# Patient Record
Sex: Male | Born: 1960
Health system: Southern US, Community
[De-identification: ages and names within clinical notes are randomized; demographics above are authoritative.]

## PROBLEM LIST (undated history)

## (undated) DIAGNOSIS — C801 Malignant (primary) neoplasm, unspecified: Secondary | ICD-10-CM

## (undated) DIAGNOSIS — G4700 Insomnia, unspecified: Secondary | ICD-10-CM

## (undated) DIAGNOSIS — I10 Essential (primary) hypertension: Secondary | ICD-10-CM

## (undated) DIAGNOSIS — E7800 Pure hypercholesterolemia, unspecified: Secondary | ICD-10-CM

## (undated) DIAGNOSIS — N189 Chronic kidney disease, unspecified: Secondary | ICD-10-CM

## (undated) HISTORY — PX: OTHER SURGICAL HISTORY: SHX169

## (undated) HISTORY — PX: NO PAST SURGERIES: SHX2092

---

## 2004-04-09 ENCOUNTER — Emergency Department (HOSPITAL_COMMUNITY): Admission: EM | Admit: 2004-04-09 | Discharge: 2004-04-10 | Payer: Self-pay

## 2005-08-22 ENCOUNTER — Emergency Department (HOSPITAL_COMMUNITY): Admission: EM | Admit: 2005-08-22 | Discharge: 2005-08-22 | Payer: Self-pay | Admitting: Emergency Medicine

## 2009-03-07 ENCOUNTER — Ambulatory Visit: Payer: Self-pay | Admitting: Orthopedic Surgery

## 2009-03-07 DIAGNOSIS — M79609 Pain in unspecified limb: Secondary | ICD-10-CM | POA: Insufficient documentation

## 2009-05-03 ENCOUNTER — Ambulatory Visit (HOSPITAL_COMMUNITY): Admission: RE | Admit: 2009-05-03 | Discharge: 2009-05-03 | Payer: Self-pay | Admitting: Family Medicine

## 2009-08-20 ENCOUNTER — Ambulatory Visit (HOSPITAL_COMMUNITY): Admission: RE | Admit: 2009-08-20 | Discharge: 2009-08-20 | Payer: Self-pay | Admitting: Urology

## 2009-12-05 ENCOUNTER — Ambulatory Visit (HOSPITAL_COMMUNITY): Admission: RE | Admit: 2009-12-05 | Discharge: 2009-12-05 | Payer: Self-pay | Admitting: Family Medicine

## 2010-12-16 ENCOUNTER — Ambulatory Visit: Payer: Self-pay | Admitting: Internal Medicine

## 2011-01-27 NOTE — Letter (Signed)
Summary: Historic Patient File  Historic Patient File   Imported By: Elvera Maria 03/12/2009 10:35:47  _____________________________________________________________________  External Attachment:    Type:   Image     Comment:   history

## 2011-01-27 NOTE — Assessment & Plan Note (Signed)
Summary: RT FOOT PAIN/NEEDS XRAY/REF [facility]   Vital Signs:  Patient profile:   50 year old male Weight:      190 pounds Pulse (ortho):   74 / minute Resp:     18 per minute  Vitals Entered By: Fuller Canada MD (March 07, 2009 11:05 AM)  History of Present Illness: Today 03/07/09 we are seeing Travis Warner for an initial visit for right foot pain, consult Dr. Regino Schultze.  [name] has had pain in his RIGHT foot for the last 2 months no associated injury.  The pain is on the plantar aspect of the foot under the second and third digits.  He has a history of gout of the great toe and actually took some medicine yesterday and it feels better today.  His pain initially was under the ball of his foot second and third digit it progressed to the great toe and relieved by thegout medicine as stated.  He also took some prednisone 5 mg dose pack which helped while he was taking it but the pain returned after the medicine was completed.  He also took some Vicodin as well some ibuprofen.  He only has pain when he is walking.  Pain only with walking.    Preventive Screening-Counseling & Management     Alcohol drinks/day: 0     Smoking Status: never     Caffeine use/day: 0  Allergies (verified): No Known Drug Allergies  Past Medical History:    gout  Past Surgical History:    NA  Family History:    NA  Social History:    Patient is married.     childcare    Alcohol drinks/day:  0    Smoking Status:  never    Caffeine use/day:  0  Review of Systems GU:  Complains of kidney stones; denies kidney failure, kidney transplant, burning, poor stream, testicular cancer, blood in urine, and . MS:  Complains of gout; denies joint pain, rheumatoid arthritis, joint swelling, bone cancer, osteoporosis, and . Derm:  Complains of cancer; denies eczema and itching.  The review of systems is negative for General, Cardiac , Resp, GI, Neuro, Endo, Psych, EENT, Immunology, and  Lymphatic.  Physical Exam  Additional Exam:  this is a normally developed well-nourished male with normal pulses in his RIGHT foot there is no lymphangitis in the skin is normal  Sensation is intact to soft touch and pressure and he is awake and alert  On inspection there is tenderness over the great toe none under the second and third digit there is no pain with range of motion of the first 3 digits and there is normal range of motion of the great toe second and third digit.   Impression & Recommendations:  Problem # 1:  FOOT PAIN, RIGHT (ICD-729.5) Assessment New  Orders: New Patient Level III (98119) Foot x-ray complete, minimum 3 views (14782)  Patient Instructions: 1)  as needed

## 2011-01-27 NOTE — Letter (Signed)
Summary: *Orthopedic Consult Note  [facility]  [address]  [place], [place] [address]   Phone: [phone]  Fax: [phone]    Re:    Travis Warner DOB:    09-27-1961   Dear: Dr Regino Schultze   Thank you for requesting that we see the above patient for consultation.  A copy of the detailed office note will be sent under separate cover, for your review.  Evaluation today is consistent with: Metatarsalgia   Our recommendation is for: Metatarsal pads        Thank you for this opportunity to look after your patient.  Sincerely,   Terrance Mass. MD.

## 2011-12-08 ENCOUNTER — Telehealth: Payer: Self-pay

## 2011-12-08 NOTE — Telephone Encounter (Signed)
MOVI PREP SPLIT DOSING, REGULAR BREAKFAST. CLEAR LIQUIDS AFTER 9 AM.  

## 2011-12-08 NOTE — Telephone Encounter (Signed)
Gastroenterology Pre-Procedure Form   Pt's wife to call back and schedule date and time   Request date: 12/07/2011      Requesting physician: Lenise Herald, PA-C at [facility]   PATIENT INFORMATION:  Travis Warner is a 50 y.o., male (DOB=05-04-1961).  PROCEDURE: Procedure(s) requested: colonoscopy Procedure Reason: screening for colon cancer  PATIENT REVIEW QUESTIONS: The patient reports the following:   1. Diabetes Melitis: no 2. Joint replacements in the past 12 months: no 3. Major health problems in the past 3 months: no 4. Has an artificial valve or MVP:no 5. Has been advised in past to take antibiotics in advance of a procedure like teeth cleaning: no}    MEDICATIONS & ALLERGIES:    Patient reports the following regarding taking any blood thinners:   Plavix? no Aspirin?no Coumadin?  no  Patient confirms/reports the following medications:  Current Outpatient Prescriptions  Medication Sig Dispense Refill  . fluticasone (FLONASE) 50 MCG/ACT nasal spray Place 2 sprays into the nose daily.        . simvastatin (ZOCOR) 20 MG tablet Take 20 mg by mouth at bedtime.          Patient confirms/reports the following allergies:  No Known Allergies  Patient is appropriate to schedule for requested procedure(s): yes  AUTHORIZATION INFORMATION Primary Insurance:   ID #:   Group #:   Pre-Cert / Auth #:   Secondary Insurance:  ID #:   Group #:  Pre-Cert / Auth required:  Pre-Cert / Auth #:   No orders of the defined types were placed in this encounter.    SCHEDULE INFORMATION: Procedure has been scheduled as follows:  Date:                           Time Location: [facility] Short Stay  This Gastroenterology Pre-Precedure Form is being routed to the following provider(s) for review: Jonette Eva, MD

## 2011-12-10 NOTE — Telephone Encounter (Signed)
LMOM to call.

## 2011-12-14 NOTE — Telephone Encounter (Signed)
Spoke with pt's wife. She said he has been sick over the week-end. He is planning to schedule around first part of Jan.

## 2011-12-17 NOTE — Telephone Encounter (Signed)
Spoke to pt's wife. She is going to discuss with husband and call me back.

## 2011-12-23 NOTE — Telephone Encounter (Signed)
Letter mailed to pt to call and schedule appointment.

## 2011-12-26 ENCOUNTER — Emergency Department (HOSPITAL_COMMUNITY): Payer: PRIVATE HEALTH INSURANCE

## 2011-12-26 ENCOUNTER — Emergency Department (HOSPITAL_COMMUNITY)
Admission: EM | Admit: 2011-12-26 | Discharge: 2011-12-26 | Disposition: A | Discharge status: Home / Self Care | Payer: PRIVATE HEALTH INSURANCE | Attending: Emergency Medicine | Admitting: Emergency Medicine

## 2011-12-26 DIAGNOSIS — N200 Calculus of kidney: Secondary | ICD-10-CM | POA: Insufficient documentation

## 2011-12-26 DIAGNOSIS — E7800 Pure hypercholesterolemia, unspecified: Secondary | ICD-10-CM | POA: Insufficient documentation

## 2011-12-26 HISTORY — DX: Pure hypercholesterolemia, unspecified: E78.00

## 2011-12-26 LAB — URINALYSIS, ROUTINE W REFLEX MICROSCOPIC
Bilirubin Urine: NEGATIVE
Glucose, UA: NEGATIVE mg/dL
Ketones, ur: NEGATIVE mg/dL
Leukocytes, UA: NEGATIVE
Nitrite: NEGATIVE
Protein, ur: NEGATIVE mg/dL
Specific Gravity, Urine: 1.01 (ref 1.005–1.030)
Urobilinogen, UA: 0.2 mg/dL (ref 0.0–1.0)
pH: 6 (ref 5.0–8.0)

## 2011-12-26 LAB — URINE MICROSCOPIC-ADD ON

## 2011-12-26 MED ORDER — ONDANSETRON HCL 4 MG PO TABS: 8.0000 mg | ORAL_TABLET | Freq: Four times a day (QID) | ORAL | Status: AC

## 2011-12-26 MED ORDER — OXYCODONE-ACETAMINOPHEN 5-325 MG PO TABS
2.0000 | ORAL_TABLET | ORAL | Status: AC | PRN
Start: 2011-12-26 — End: 2012-01-05

## 2011-12-26 NOTE — ED Provider Notes (Signed)
History     CSN: [id number]  Arrival date & time 12/26/11  1232   First MD Initiated Contact with Patient 12/26/11 1308      Chief Complaint  Patient presents with  . Urinary Urgency  . Urinary Frequency  . Dysuria    (Consider location/radiation/quality/duration/timing/severity/associated sxs/prior treatment) HPI......Marland Kitchenurinary frequency, urgency, dysuria for approximately 3 days. No hematuria, flank pain, fever, chills.  Eating normally.nothing makes it better or worse.  remote history of kidney stone.  No suprapubic pain.  Discomfort is moderate  Past Medical History  Diagnosis Date  . Hypercholesterolemia     History reviewed. No pertinent past surgical history.  No family history on file.  History  Substance Use Topics  . Smoking status: Never Smoker   . Smokeless tobacco: Not on file  . Alcohol Use: No      Review of Systems  All other systems reviewed and are negative.    Allergies  Review of patient's allergies indicates no known allergies.  Home Medications   Current Outpatient Rx  Name Route Sig Dispense Refill  . FLUTICASONE PROPIONATE 50 MCG/ACT NA SUSP Nasal Place 2 sprays into the nose daily.      Marland Kitchen SIMVASTATIN 20 MG PO TABS Oral Take 20 mg by mouth at bedtime.      Marland Kitchen ZOLPIDEM TARTRATE 10 MG PO TABS Oral Take 10 mg by mouth at bedtime as needed. sleep     . ONDANSETRON HCL 4 MG PO TABS Oral Take 2 tablets (8 mg total) by mouth every 6 (six) hours. 12 tablet 0  . OXYCODONE-ACETAMINOPHEN 5-325 MG PO TABS Oral Take 2 tablets by mouth every 4 (four) hours as needed for pain. 20 tablet 0    BP 154/96  Pulse 64  Temp(Src) 97.7 F (36.5 C) (Oral)  Resp 18  Ht 5\' 11"  (1.803 m)  Wt 190 lb (86.183 kg)  BMI 26.50 kg/m2  SpO2 100%  Physical Exam  Nursing note and vitals reviewed. Constitutional: He is oriented to person, place, and time. He appears well-developed and well-nourished.  HENT:  Head: Normocephalic and atraumatic.  Eyes:  Conjunctivae and EOM are normal. Pupils are equal, round, and reactive to light.  Neck: Normal range of motion. Neck supple.  Cardiovascular: Normal rate and regular rhythm.   Pulmonary/Chest: Effort normal and breath sounds normal.  Abdominal: Soft. Bowel sounds are normal.  Musculoskeletal: Normal range of motion.  Neurological: He is alert and oriented to person, place, and time.  Skin: Skin is warm and dry.  Psychiatric: He has a normal mood and affect.    ED Course  Procedures (including critical care time)  Labs Reviewed  URINALYSIS, ROUTINE W REFLEX MICROSCOPIC - Abnormal; Notable for the following:    Hgb urine dipstick MODERATE (*)    All other components within normal limits  URINE MICROSCOPIC-ADD ON   Ct Abdomen Pelvis Wo Contrast  12/26/2011  *RADIOLOGY REPORT*  Clinical Data: Hematuria, lower pelvic pain  CT ABDOMEN AND PELVIS WITHOUT CONTRAST  Technique:  Multidetector CT imaging of the abdomen and pelvis was performed following the standard protocol without intravenous contrast.  Comparison: 08/22/2005  Findings: Lung bases are unremarkable.  Sagittal images of the spine shows mild anterior spurring upper endplate of the L3 vertebral body.  Unenhanced liver shows no biliary ductal dilatation.  The gallbladder is contracted without evidence of calcified gallstones. Unenhanced pancreas, spleen and adrenal glands are unremarkable. There is a nonobstructive calcification in the upper pole of the right kidney  measures 1.6 mm.  Nonobstructive calcified calculus upper pole of the left kidney measures 2 mm.  No hydronephrosis or hydroureter.  In axial image 62 of there is a 5 mm calcified calculus in distal right ureter about 2.4 cm from right UVJ.  No significant ureteral dilatation.  No calcified calculi are noted along the expected course of the left ureter.  No small bowel obstruction.  No ascites or free air.  No adenopathy.  Stool noted in the right colon and transverse colon. No  pericecal inflammation.  The urinary bladder is under distended grossly unremarkable.  No calcified calculi are noted within urinary bladder.  Prostate gland measures 4.1 x 2.9 cm.  Normal appendix is clearly visualized axial image 56  IMPRESSION:  1.  Bilateral nonobstructive nephrolithiasis.  No hydronephrosis or hydroureter. 2.  There is a 5 mm calcified calculus in the distal right ureter about 2.4 cm from right UVJ.  No significant ureteral dilatation. No left ureteral calculi are identified. 3.  Normal appendix.  No pericecal inflammation.  Original Report Authenticated By: Natasha Mead, M.D.     1. Right kidney stone       MDM  Patient has no pain. Urinalysis shows hemoglobin. CT scan shows a 5 mm stone in the distal right ureter.  Discussed findings with patient and wife. They understand to get urology followup        Donnetta Hutching, MD 12/26/11 1616

## 2011-12-26 NOTE — ED Notes (Signed)
Pt c/o a constant urge to urinate. Pt c/o burning to tip of penis with urination and a continued urge after completion.

## 2011-12-26 NOTE — ED Notes (Addendum)
Pt presents with urinary urgency and increased frequency. Pt states once he starts to urinate he experiences pain in the groin area. Pt denies blood in urine. Pt states symptoms started 3 days ago.

## 2012-02-09 ENCOUNTER — Telehealth: Payer: Self-pay

## 2012-02-09 ENCOUNTER — Other Ambulatory Visit: Payer: Self-pay

## 2012-02-09 DIAGNOSIS — Z139 Encounter for screening, unspecified: Secondary | ICD-10-CM

## 2012-02-09 NOTE — Telephone Encounter (Addendum)
Gastroenterology Pre-Procedure Form  Pt's wife called and gave info to schedule pt  Request Date: 02/09/2012       Requesting Physician: Lenise Herald, PA-C at [facility]     PATIENT INFORMATION:  Travis Warner is a 51 y.o., male (DOB=[birth year]).  PROCEDURE: Procedure(s) requested: colonoscopy Procedure Reason: screening for colon cancer  PATIENT REVIEW QUESTIONS: The patient reports the following:   1. Diabetes Melitis: no 2. Joint replacements in the past 12 months: no 3. Major health problems in the past 3 months: no 4. Has an artificial valve or MVP:no 5. Has been advised in past to take antibiotics in advance of a procedure like teeth cleaning: no}    MEDICATIONS & ALLERGIES:    Patient reports the following regarding taking any blood thinners:   Plavix? no Aspirin?no Coumadin?  no  Patient confirms/reports the following medications:  Current Outpatient Prescriptions  Medication Sig Dispense Refill  . fluticasone (FLONASE) 50 MCG/ACT nasal spray Place 2 sprays into the nose daily.        . simvastatin (ZOCOR) 20 MG tablet Take 20 mg by mouth at bedtime.        Marland Kitchen zolpidem (AMBIEN) 10 MG tablet Take 10 mg by mouth at bedtime as needed. Pt's wife said he takes very seldom        Patient confirms/reports the following allergies:  No Known Allergies  Patient is appropriate to schedule for requested procedure(s): yes  AUTHORIZATION INFORMATION Primary Insurance:   ID #:   Group #:  Pre-Cert / Auth required: Pre-Cert / Auth #:   Secondary Insurance:   ID #:   Group #:  Pre-Cert / Auth required Pre-Cert / Auth #:   No orders of the defined types were placed in this encounter.    SCHEDULE INFORMATION: Procedure has been scheduled as follows:  Date: 02/24/2012     Time: 12:25 pm per Selena Batten Location: [facility] Short Stay  This Gastroenterology Pre-Precedure Form is being routed to the following provider(s) for review: R. Roetta Sessions, MD

## 2012-02-10 NOTE — Telephone Encounter (Signed)
Looks like [facility] patient? Initial triage in 11/2011. OK to schedule.

## 2012-02-11 NOTE — Telephone Encounter (Signed)
Pt has never seen Dr. Jena Gauss or Dr. Darrick Penna. Although  Previously triaged for Dr. Darrick Penna was trying to be scheduled on Wed that week, and Dr. Darrick Penna in office on that day.  Rx and instructions mailed.

## 2012-02-22 ENCOUNTER — Encounter (HOSPITAL_COMMUNITY): Payer: Self-pay | Admitting: Pharmacy Technician

## 2012-02-23 MED ORDER — SODIUM CHLORIDE 0.45 % IV SOLN
Freq: Once | INTRAVENOUS | Status: AC
  Administered 2012-02-24: 11:00:00 via INTRAVENOUS

## 2012-02-24 ENCOUNTER — Encounter (HOSPITAL_COMMUNITY): Payer: Self-pay | Admitting: *Deleted

## 2012-02-24 ENCOUNTER — Encounter (HOSPITAL_COMMUNITY)
Admission: RE | Disposition: A | Discharge status: Home / Self Care | Payer: Self-pay | Source: Ambulatory Visit | Attending: Internal Medicine

## 2012-02-24 ENCOUNTER — Ambulatory Visit (HOSPITAL_COMMUNITY)
Admission: RE | Admit: 2012-02-24 | Discharge: 2012-02-24 | Disposition: A | Discharge status: Home / Self Care | Payer: No Typology Code available for payment source | Source: Ambulatory Visit | Attending: Internal Medicine | Admitting: Internal Medicine

## 2012-02-24 DIAGNOSIS — Z139 Encounter for screening, unspecified: Secondary | ICD-10-CM

## 2012-02-24 DIAGNOSIS — D126 Benign neoplasm of colon, unspecified: Secondary | ICD-10-CM

## 2012-02-24 DIAGNOSIS — Z1211 Encounter for screening for malignant neoplasm of colon: Secondary | ICD-10-CM | POA: Insufficient documentation

## 2012-02-24 DIAGNOSIS — E7800 Pure hypercholesterolemia, unspecified: Secondary | ICD-10-CM | POA: Insufficient documentation

## 2012-02-24 HISTORY — DX: Insomnia, unspecified: G47.00

## 2012-02-24 HISTORY — DX: Malignant (primary) neoplasm, unspecified: C80.1

## 2012-02-24 HISTORY — DX: Chronic kidney disease, unspecified: N18.9

## 2012-02-24 HISTORY — PX: COLONOSCOPY: SHX5424

## 2012-02-24 SURGERY — COLONOSCOPY
Anesthesia: Moderate Sedation

## 2012-02-24 MED ORDER — MIDAZOLAM HCL 5 MG/5ML IJ SOLN
INTRAMUSCULAR | Status: AC
  Filled 2012-02-24: qty 10

## 2012-02-24 MED ORDER — STERILE WATER FOR IRRIGATION IR SOLN
Status: DC | PRN
  Administered 2012-02-24: 12:00:00

## 2012-02-24 MED ORDER — MEPERIDINE HCL 100 MG/ML IJ SOLN
INTRAMUSCULAR | Status: DC | PRN
  Administered 2012-02-24: 25 mg via INTRAVENOUS
  Administered 2012-02-24: 50 mg via INTRAVENOUS
  Administered 2012-02-24: 25 mg via INTRAVENOUS

## 2012-02-24 MED ORDER — MEPERIDINE HCL 100 MG/ML IJ SOLN
INTRAMUSCULAR | Status: AC
  Filled 2012-02-24: qty 2

## 2012-02-24 MED ORDER — MIDAZOLAM HCL 5 MG/5ML IJ SOLN
INTRAMUSCULAR | Status: DC | PRN
  Administered 2012-02-24: 2 mg via INTRAVENOUS
  Administered 2012-02-24 (×2): 1 mg via INTRAVENOUS

## 2012-02-24 NOTE — H&P (Signed)
  Primary Care Physician:  Colette Ribas, MD, MD Primary Gastroenterologist:  Dr.   Pre-Procedure History & Physical: HPI:  Travis Warner is a 51 y.o. male is here for a screening colonoscopy. First-ever colonoscopy. No bowel symptoms. No family history of colon cancer or colon polyps.  Past Medical History  Diagnosis Date  . Hypercholesterolemia   . Insomnia   . Cancer     skin cancer- basal cell on face  . Chronic kidney disease     kidney stone    Past Surgical History  Procedure Date  . No past surgeries     Prior to Admission medications   Medication Sig Start Date End Date Taking? Authorizing Provider  simvastatin (ZOCOR) 20 MG tablet Take 20 mg by mouth at bedtime.     Yes Historical Provider, MD  Vitamins-Lipotropics (LIPO-FLAVONOID PLUS) TABS Take 1 tablet by mouth 3 (three) times daily.   Yes Historical Provider, MD  zolpidem (AMBIEN) 10 MG tablet Take 10 mg by mouth at bedtime as needed. Pt's wife said he takes very seldom   Yes Historical Provider, MD  fluticasone (FLONASE) 50 MCG/ACT nasal spray Place 2 sprays into the nose daily. Only uses when needed    Historical Provider, MD    Allergies as of 02/09/2012  . (No Known Allergies)    Family History  Problem Relation Age of Onset  . COPD Mother   . Liver cancer Father     History   Social History  . Marital Status: Married    Spouse Name: N/A    Number of Children: N/A  . Years of Education: N/A   Occupational History  . Not on file.   Social History Main Topics  . Smoking status: Never Smoker   . Smokeless tobacco: Not on file  . Alcohol Use: No  . Drug Use: No  . Sexually Active: Yes   Other Topics Concern  . Not on file   Social History Narrative  . No narrative on file    Review of Systems: See HPI, otherwise negative ROS  Physical Exam: BP 155/103  Pulse 96  Temp(Src) 98.9 F (37.2 C) (Oral)  Resp 16  Ht 5\' 11"  (1.803 m)  Wt 190 lb (86.183 kg)  BMI 26.50 kg/m2  SpO2  100% General:   Alert,  Well-developed, well-nourished, pleasant and cooperative in NAD Head:  Normocephalic and atraumatic. Eyes:  Sclera clear, no icterus.   Conjunctiva pink. Ears:  Normal auditory acuity. Nose:  No deformity, discharge,  or lesions. Mouth:  No deformity or lesions, dentition normal. Neck:  Supple; no masses or thyromegaly. Lungs:  Clear throughout to auscultation.   No wheezes, crackles, or rhonchi. No acute distress. Heart:  Regular rate and rhythm; no murmurs, clicks, rubs,  or gallops. Abdomen:  Soft, nontender and nondistended. No masses, hepatosplenomegaly or hernias noted. Normal bowel sounds, without guarding, and without rebound.   Msk:  Symmetrical without gross deformities. Normal posture. Pulses:  Normal pulses noted. Extremities:  Without clubbing or edema. Neurologic:  Alert and  oriented x4;  grossly normal neurologically. Skin:  Intact without significant lesions or rashes. Cervical Nodes:  No significant cervical adenopathy. Psych:  Alert and cooperative. Normal mood and affect.  Impression/Plan: Travis Warner is now here to undergo a screening colonoscopy.   First-ever average risk screening examination  Risks, benefits, limitations, imponderables and alternatives regarding colonoscopy have been reviewed with the patient. Questions have been answered. All parties agreeable.

## 2012-02-24 NOTE — Op Note (Signed)
[facility] [address] [place], [place]  [address]  COLONOSCOPY PROCEDURE REPORT  PATIENT:  Travis Warner, Travis Warner  MR#:  [id number] BIRTHDATE:  07/25/1961, 50 yrs. old  GENDER:  male ENDOSCOPIST:  R. Roetta Sessions, MD FACP [facility] REF. BY:  Assunta Found, M.D. PROCEDURE DATE:  02/24/2012 PROCEDURE:  Colonoscopy with biopsy and snare polypectomy  INDICATIONS:  First ever average risk screening colonoscopy  INFORMED CONSENT:  The risks, benefits, alternatives and imponderables including but not limited to bleeding, perforation as well as the possibility of a missed lesion have been reviewed. The potential for biopsy, lesion removal, etc. have also been discussed.  Questions have been answered.  All parties agreeable. Please see the history and physical in the medical record for more information.  MEDICATIONS:  Versed 4 mg IV and Demerol 100 mg IV in divided doses  DESCRIPTION OF PROCEDURE:  After a digital rectal exam was performed, the EC-[id number] ([id number]) colonoscope was advanced from the anus through the rectum and colon to the area of the cecum, ileocecal valve and appendiceal orifice.  The cecum was deeply intubated.  These structures were well-seen and photographed for the record.  From the level of the cecum and ileocecal valve, the scope was slowly and cautiously withdrawn.  The mucosal surfaces were carefully surveyed utilizing scope tip deflection to facilitate fold flattening as needed.  The scope was pulled down into the rectum where a thorough examination including retroflexion was performed. <<PROCEDUREIMAGES>>  FINDINGS: Adequate preparation. Normal rectum. 6 mm pedunculated polyp in the mid descending colon. A second, diminutive polyp in the mid                                         ascending colon; remainder of colonic mucosa appeared normal.  THERAPEUTIC / DIAGNOSTIC MANEUVERS PERFORMED:    The above-mentioned polyps were hot snared and cold  biopsy/removed, respectively. COMPLICATIONS:  None  CECAL WITHDRAWAL TIME:   8 minutes  IMPRESSION:    Colonic polyps -- removed as described above  RECOMMENDATIONS:    Followup on pathology  ______________________________ R. Roetta Sessions, MD Caleen Essex  CC:  Assunta Found, M.D.  n. eSIGNED:   R. Roetta Sessions at 02/24/2012 11:59 AM  Travis Warner, [place], [phone]

## 2012-02-24 NOTE — Discharge Instructions (Addendum)
Colonoscopy Discharge Instructions  Read the instructions outlined below and refer to this sheet in the next few weeks. These discharge instructions provide you with general information on caring for yourself after you leave the hospital. Your doctor may also give you specific instructions. While your treatment has been planned according to the most current medical practices available, unavoidable complications occasionally occur. If you have any problems or questions after discharge, call Dr. Jena Gauss at [phone]. ACTIVITY  You may resume your regular activity, but move at a slower pace for the next 24 hours.   Take frequent rest periods for the next 24 hours.   Walking will help get rid of the air and reduce the bloated feeling in your belly (abdomen).   No driving for 24 hours (because of the medicine (anesthesia) used during the test).    Do not sign any important legal documents or operate any machinery for 24 hours (because of the anesthesia used during the test).  NUTRITION  Drink plenty of fluids.   You may resume your normal diet as instructed by your doctor.   Begin with a light meal and progress to your normal diet. Heavy or fried foods are harder to digest and may make you feel sick to your stomach (nauseated).   Avoid alcoholic beverages for 24 hours or as instructed.  MEDICATIONS  You may resume your normal medications unless your doctor tells you otherwise.  WHAT YOU CAN EXPECT TODAY  Some feelings of bloating in the abdomen.   Passage of more gas than usual.   Spotting of blood in your stool or on the toilet paper.  IF YOU HAD POLYPS REMOVED DURING THE COLONOSCOPY:  No aspirin products for 7 days or as instructed.   No alcohol for 7 days or as instructed.   Eat a soft diet for the next 24 hours.  FINDING OUT THE RESULTS OF YOUR TEST Not all test results are available during your visit. If your test results are not back during the visit, make an appointment  with your caregiver to find out the results. Do not assume everything is normal if you have not heard from your caregiver or the medical facility. It is important for you to follow up on all of your test results.  SEEK IMMEDIATE MEDICAL ATTENTION IF:  You have more than a spotting of blood in your stool.   Your belly is swollen (abdominal distention).   You are nauseated or vomiting.   You have a temperature over 101.   You have abdominal pain or discomfort that is severe or gets worse throughout the day.   Polyp information provided.  Further recommendations to follow pending review of pathology report.Colon Polyps A polyp is extra tissue that grows inside your body. Colon polyps grow in the large intestine. The large intestine, also called the colon, is part of your digestive system. It is a long, hollow tube at the end of your digestive tract where your body makes and stores stool. Most polyps are not dangerous. They are benign. This means they are not cancerous. But over time, some types of polyps can turn into cancer. Polyps that are smaller than a pea are usually not harmful. But larger polyps could someday become or may already be cancerous. To be safe, doctors remove all polyps and test them.  WHO GETS POLYPS? Anyone can get polyps, but certain people are more likely than others. You may have a greater chance of getting polyps if:  You are over  50.   You have had polyps before.   Someone in your family has had polyps.   Someone in your family has had cancer of the large intestine.   Find out if someone in your family has had polyps. You may also be more likely to get polyps if you:   Eat a lot of fatty foods.   Smoke.   Drink alcohol.   Do not exercise.   Eat too much.  SYMPTOMS  Most small polyps do not cause symptoms. People often do not know they have one until their caregiver finds it during a regular checkup or while testing them for something else. Some people  do have symptoms like these:  Bleeding from the anus. You might notice blood on your underwear or on toilet paper after you have had a bowel movement.   Constipation or diarrhea that lasts more than a week.   Blood in the stool. Blood can make stool look black or it can show up as red streaks in the stool.  If you have any of these symptoms, see your caregiver. HOW DOES THE DOCTOR TEST FOR POLYPS? The doctor can use four tests to check for polyps:  Digital rectal exam. The caregiver wears gloves and checks your rectum (the last part of the large intestine) to see if it feels normal. This test would find polyps only in the rectum. Your caregiver may need to do one of the other tests listed below to find polyps higher up in the intestine.   Barium enema. The caregiver puts a liquid called barium into your rectum before taking x-rays of your large intestine. Barium makes your intestine look white in the pictures. Polyps are dark, so they are easy to see.   Sigmoidoscopy. With this test, the caregiver can see inside your large intestine. A thin flexible tube is placed into your rectum. The device is called a sigmoidoscope, which has a light and a tiny video camera in it. The caregiver uses the sigmoidoscope to look at the last third of your large intestine.   Colonoscopy. This test is like sigmoidoscopy, but the caregiver looks at all of the large intestine. It usually requires sedation. This is the most common method for finding and removing polyps.  TREATMENT   The caregiver will remove the polyp during sigmoidoscopy or colonoscopy. The polyp is then tested for cancer.   If you have had polyps, your caregiver may want you to get tested regularly in the future.  PREVENTION  There is not one sure way to prevent polyps. You might be able to lower your risk of getting them if you:  Eat more fruits and vegetables and less fatty food.   Do not smoke.   Avoid alcohol.   Exercise every day.    Lose weight if you are overweight.   Eating more calcium and folate can also lower your risk of getting polyps. Some foods that are rich in calcium are milk, cheese, and broccoli. Some foods that are rich in folate are chickpeas, kidney beans, and spinach.   Aspirin might help prevent polyps. Studies are under way.  Document Released: 09/09/2004 Document Revised: 08/26/2011 Document Reviewed: 02/15/2008 [facility] Patient Information 2012 [place], [place].

## 2012-02-27 ENCOUNTER — Encounter: Payer: Self-pay | Admitting: Internal Medicine

## 2012-03-01 ENCOUNTER — Encounter (HOSPITAL_COMMUNITY): Payer: Self-pay | Admitting: Internal Medicine

## 2013-11-29 ENCOUNTER — Other Ambulatory Visit: Payer: Self-pay | Admitting: Dermatology

## 2015-02-07 ENCOUNTER — Emergency Department (HOSPITAL_COMMUNITY): Payer: 59

## 2015-02-07 ENCOUNTER — Encounter (HOSPITAL_COMMUNITY): Payer: Self-pay | Admitting: Emergency Medicine

## 2015-02-07 ENCOUNTER — Emergency Department (HOSPITAL_COMMUNITY)
Admission: EM | Admit: 2015-02-07 | Discharge: 2015-02-07 | Disposition: A | Discharge status: Home / Self Care | Payer: 59 | Attending: Emergency Medicine | Admitting: Emergency Medicine

## 2015-02-07 DIAGNOSIS — Z85828 Personal history of other malignant neoplasm of skin: Secondary | ICD-10-CM | POA: Diagnosis not present

## 2015-02-07 DIAGNOSIS — N189 Chronic kidney disease, unspecified: Secondary | ICD-10-CM | POA: Insufficient documentation

## 2015-02-07 DIAGNOSIS — Z8669 Personal history of other diseases of the nervous system and sense organs: Secondary | ICD-10-CM | POA: Diagnosis not present

## 2015-02-07 DIAGNOSIS — R0789 Other chest pain: Secondary | ICD-10-CM

## 2015-02-07 DIAGNOSIS — R079 Chest pain, unspecified: Secondary | ICD-10-CM

## 2015-02-07 DIAGNOSIS — Z8639 Personal history of other endocrine, nutritional and metabolic disease: Secondary | ICD-10-CM | POA: Diagnosis not present

## 2015-02-07 DIAGNOSIS — Z87448 Personal history of other diseases of urinary system: Secondary | ICD-10-CM | POA: Diagnosis not present

## 2015-02-07 LAB — COMPREHENSIVE METABOLIC PANEL
ALT: 40 U/L (ref 0–53)
AST: 27 U/L (ref 0–37)
Albumin: 4.5 g/dL (ref 3.5–5.2)
Alkaline Phosphatase: 71 U/L (ref 39–117)
Anion gap: 4 — ABNORMAL LOW (ref 5–15)
BUN: 21 mg/dL (ref 6–23)
CO2: 30 mmol/L (ref 19–32)
Calcium: 9.5 mg/dL (ref 8.4–10.5)
Chloride: 106 mmol/L (ref 96–112)
Creatinine, Ser: 1.18 mg/dL (ref 0.50–1.35)
GFR calc Af Amer: 80 mL/min — ABNORMAL LOW (ref >90)
GFR calc non Af Amer: 69 mL/min — ABNORMAL LOW (ref >90)
Glucose, Bld: 100 mg/dL — ABNORMAL HIGH (ref 70–99)
Potassium: 3.4 mmol/L — ABNORMAL LOW (ref 3.5–5.1)
Sodium: 140 mmol/L (ref 135–145)
Total Bilirubin: 0.8 mg/dL (ref 0.3–1.2)
Total Protein: 7.5 g/dL (ref 6.0–8.3)

## 2015-02-07 LAB — CBC WITH DIFFERENTIAL/PLATELET
Basophils Absolute: 0 10*3/uL (ref 0.0–0.1)
Basophils Relative: 0 % (ref 0–1)
Eosinophils Absolute: 0.2 10*3/uL (ref 0.0–0.7)
Eosinophils Relative: 3 % (ref 0–5)
HCT: 44.2 % (ref 39.0–52.0)
Hemoglobin: 15 g/dL (ref 13.0–17.0)
Lymphocytes Relative: 44 % (ref 12–46)
Lymphs Abs: 3.2 10*3/uL (ref 0.7–4.0)
MCH: 30.4 pg (ref 26.0–34.0)
MCHC: 33.9 g/dL (ref 30.0–36.0)
MCV: 89.7 fL (ref 78.0–100.0)
Monocytes Absolute: 0.6 10*3/uL (ref 0.1–1.0)
Monocytes Relative: 8 % (ref 3–12)
Neutro Abs: 3.2 10*3/uL (ref 1.7–7.7)
Neutrophils Relative %: 45 % (ref 43–77)
Platelets: 219 10*3/uL (ref 150–400)
RBC: 4.93 MIL/uL (ref 4.22–5.81)
RDW: 13.1 % (ref 11.5–15.5)
WBC: 7.1 10*3/uL (ref 4.0–10.5)

## 2015-02-07 LAB — TROPONIN I: Troponin I: <0.03 ng/mL (ref <0.031)

## 2015-02-07 LAB — D-DIMER, QUANTITATIVE: D-Dimer, Quant: <0.27 ug/mL-FEU (ref 0.00–0.48)

## 2015-02-07 MED ORDER — KETOROLAC TROMETHAMINE 30 MG/ML IJ SOLN
30.0000 mg | Freq: Once | INTRAMUSCULAR | Status: AC
  Administered 2015-02-07: 30 mg via INTRAVENOUS
  Filled 2015-02-07: qty 1

## 2015-02-07 MED ORDER — NAPROXEN 500 MG PO TABS: 500.0000 mg | ORAL_TABLET | Freq: Two times a day (BID) | ORAL | Status: DC

## 2015-02-07 MED ORDER — IOHEXOL 350 MG/ML SOLN: 100.0000 mL | Freq: Once | INTRAVENOUS | Status: DC | PRN

## 2015-02-07 NOTE — ED Provider Notes (Signed)
CSN: [id number]     Arrival date & time 02/07/15  1093 History  This chart was scribed for Travis Essex, MD by Stephania Fragmin, ED Scribe. This patient was seen in room [facility] and the patient's care was started at 8:56 AM.    Chief Complaint  Patient presents with  . Chest Pain   The history is provided by the patient. No language interpreter was used.     HPI Comments: Travis Warner is a 54 y.o. male who presents to the Emergency Department complaining of constant left sided chest pain radiating to his back, ongoing since 2 days ago. Deep inspiration exacerbates it. Lying supine alleviates it. Nothing else makes it better or worse. He denies recent injuries. He denies a history of cardiac problems or any chronic health problems. He used to take hypertension and hyperlipidemia medication, but was taken off both because he was doing well and lost weight. He denies SOB, diaphoresis, dizziness, lightheadedness, nausea, vomiting, or abdominal pain.   Past Medical History  Diagnosis Date  . Hypercholesterolemia   . Insomnia   . Cancer     skin cancer- basal cell on face  . Chronic kidney disease     kidney stone   Past Surgical History  Procedure Laterality Date  . No past surgeries    . Colonoscopy  02/24/2012    Procedure: COLONOSCOPY;  Surgeon: Daneil Dolin, MD;  Location: [facility];  Service: Endoscopy;  Laterality: N/A;  11:30 AM   Family History  Problem Relation Age of Onset  . COPD Mother   . Liver cancer Father    History  Substance Use Topics  . Smoking status: Never Smoker   . Smokeless tobacco: Not on file  . Alcohol Use: No    Review of Systems  A complete 10 system review of systems was obtained and all systems are negative except as noted in the HPI and PMH.   Allergies  Review of patient's allergies indicates no known allergies.  Home Medications   Prior to Admission medications   Medication Sig Start Date End Date Taking? Authorizing Provider   ibuprofen (ADVIL,MOTRIN) 200 MG tablet Take 600 mg by mouth every 8 (eight) hours as needed for headache or moderate pain.   Yes Historical Provider, MD  naproxen (NAPROSYN) 500 MG tablet Take 1 tablet (500 mg total) by mouth 2 (two) times daily. 02/07/15   Travis Essex, MD   BP 142/82 mmHg  Pulse 60  Temp(Src) 97.7 F (36.5 C) (Oral)  Resp 12  Ht 5\' 9"  (1.753 m)  Wt 158 lb (71.668 kg)  BMI 23.32 kg/m2  SpO2 100% Physical Exam  Constitutional: He is oriented to person, place, and time. He appears well-developed and well-nourished. No distress.  HENT:  Head: Normocephalic and atraumatic.  Mouth/Throat: Oropharynx is clear and moist. No oropharyngeal exudate.  Eyes: Conjunctivae and EOM are normal. Pupils are equal, round, and reactive to light.  Neck: Normal range of motion. Neck supple.  No meningismus.  Cardiovascular: Normal rate, regular rhythm, normal heart sounds and intact distal pulses.   No murmur heard. Pulmonary/Chest: Effort normal and breath sounds normal. No respiratory distress. He exhibits no tenderness.  Chest wall is non-tender.  Abdominal: Soft. There is no tenderness. There is no rebound and no guarding.  Musculoskeletal: Normal range of motion. He exhibits no edema or tenderness.  Equal radial pulses and grip strengths.  Neurological: He is alert and oriented to person, place, and time. No cranial  nerve deficit. He exhibits normal muscle tone. Coordination normal.  No ataxia on finger to nose bilaterally. No pronator drift. 5/5 strength throughout. CN 2-12 intact. Negative Romberg. Equal grip strength. Sensation intact. Gait is normal.   Skin: Skin is warm. No rash noted.  Psychiatric: He has a normal mood and affect. His behavior is normal.  Nursing note and vitals reviewed.   ED Course  Procedures (including critical care time)  DIAGNOSTIC STUDIES: Oxygen Saturation is 100% on room air, normal by my interpretation.    COORDINATION OF CARE: 9:01 AM -  Discussed treatment plan with pt at bedside which includes blood tests, CXR, and D-dimer test, and pt agreed to plan.  10:33 AM - Patient reports continuing chest pain. Discussed with patient negative CXR and D-dimer tests. Doubt MI or PE. Likely muscular in nature. Patient declines a CT chest at this time. Discussed treatment plan which includes anti-inflammatory medication with pt at bedside and pt agreed to plan. Patient given precautions to return if he doesn't notice improvement.    Labs Review Labs Reviewed  COMPREHENSIVE METABOLIC PANEL - Abnormal; Notable for the following:    Potassium 3.4 (*)    Glucose, Bld 100 (*)    GFR calc non Af Amer 69 (*)    GFR calc Af Amer 80 (*)    Anion gap 4 (*)    All other components within normal limits  CBC WITH DIFFERENTIAL/PLATELET  TROPONIN I  D-DIMER, QUANTITATIVE    Imaging Review Dg Chest 2 View  02/07/2015   CLINICAL DATA:  Left chest pain radiating to the left arm.  EXAM: CHEST  2 VIEW  COMPARISON:  None.  FINDINGS: Normal heart size and mediastinal contours. No acute infiltrate or edema. No effusion or pneumothorax. Right cervical rib. No acute or explanatory osseous findings.  IMPRESSION: No active cardiopulmonary disease.   Electronically Signed   By: Monte Fantasia M.D.   On: 02/07/2015 10:12     EKG Interpretation   Date/Time:  Thursday February 07 2015 08:52:07 EST Ventricular Rate:  75 PR Interval:  141 QRS Duration: 99 QT Interval:  376 QTC Calculation: 420 R Axis:   87 Text Interpretation:  Sinus rhythm No previous ECGs available Confirmed by  Lynard Postlewait  MD, Leotha Voeltz ([id number]) on 02/07/2015 9:09:35 AM      MDM   Final diagnoses:  Chest pain  Atypical chest pain    2 days of constant left-sided chest pain radiate under left arm. Better with laying down. Denies injury. Worse with deep breathing or coughing.   No cardiac history. EKG was normal sinus rhythm. Pain is not reproducible. It is not worse with arm  movement.  Hypertensive without history of the same.  D-dimer negative. Doubt PE. Atypical for ACS.   Equal upper extremity blood pressures. D-dimer negative. Blood pressure has improved 146/82. Doubt dissection or PE.  Patient does not wish to pursue CT imaging.  This seems reasonable. Low suspicion for ACS, PE, or dissection. Follow up with PCP. Return precautions discussed.  I personally performed the services described in this documentation, which was scribed in my presence. The recorded information has been reviewed and is accurate.     Travis Essex, MD 02/07/15 1620

## 2015-02-07 NOTE — ED Notes (Signed)
MD at bedside. 

## 2015-02-07 NOTE — ED Notes (Signed)
Pt states that he has been having pain in left chest radiating under left arm since Tuesday.  Denies other symptoms.  Took Ibuprofen pta.  States hurts worse to take a deep breath or cough.

## 2015-02-07 NOTE — ED Notes (Signed)
EDP at bedside  

## 2015-02-07 NOTE — Discharge Instructions (Signed)
Chest Pain (Nonspecific)  there is no evidence of heart attack or blood clot in the lung. Follow-up with your doctor. Return to the ED for development of new or worsening symptoms. It is often hard to give a specific diagnosis for the cause of chest pain. There is always a chance that your pain could be related to something serious, such as a heart attack or a blood clot in the lungs. You need to follow up with your health care provider for further evaluation. CAUSES   Heartburn.  Pneumonia or bronchitis.  Anxiety or stress.  Inflammation around your heart (pericarditis) or lung (pleuritis or pleurisy).  A blood clot in the lung.  A collapsed lung (pneumothorax). It can develop suddenly on its own (spontaneous pneumothorax) or from trauma to the chest.  Shingles infection (herpes zoster virus). The chest wall is composed of bones, muscles, and cartilage. Any of these can be the source of the pain.  The bones can be bruised by injury.  The muscles or cartilage can be strained by coughing or overwork.  The cartilage can be affected by inflammation and become sore (costochondritis). DIAGNOSIS  Lab tests or other studies may be needed to find the cause of your pain. Your health care provider may have you take a test called an ambulatory electrocardiogram (ECG). An ECG records your heartbeat patterns over a 24-hour period. You may also have other tests, such as:  Transthoracic echocardiogram (TTE). During echocardiography, sound waves are used to evaluate how blood flows through your heart.  Transesophageal echocardiogram (TEE).  Cardiac monitoring. This allows your health care provider to monitor your heart rate and rhythm in real time.  Holter monitor. This is a portable device that records your heartbeat and can help diagnose heart arrhythmias. It allows your health care provider to track your heart activity for several days, if needed.  Stress tests by exercise or by giving  medicine that makes the heart beat faster. TREATMENT   Treatment depends on what may be causing your chest pain. Treatment may include:  Acid blockers for heartburn.  Anti-inflammatory medicine.  Pain medicine for inflammatory conditions.  Antibiotics if an infection is present.  You may be advised to change lifestyle habits. This includes stopping smoking and avoiding alcohol, caffeine, and chocolate.  You may be advised to keep your head raised (elevated) when sleeping. This reduces the chance of acid going backward from your stomach into your esophagus. Most of the time, nonspecific chest pain will improve within 2-3 days with rest and mild pain medicine.  HOME CARE INSTRUCTIONS   If antibiotics were prescribed, take them as directed. Finish them even if you start to feel better.  For the next few days, avoid physical activities that bring on chest pain. Continue physical activities as directed.  Do not use any tobacco products, including cigarettes, chewing tobacco, or electronic cigarettes.  Avoid drinking alcohol.  Only take medicine as directed by your health care provider.  Follow your health care provider's suggestions for further testing if your chest pain does not go away.  Keep any follow-up appointments you made. If you do not go to an appointment, you could develop lasting (chronic) problems with pain. If there is any problem keeping an appointment, call to reschedule. SEEK MEDICAL CARE IF:   Your chest pain does not go away, even after treatment.  You have a rash with blisters on your chest.  You have a fever. SEEK IMMEDIATE MEDICAL CARE IF:   You have increased  chest pain or pain that spreads to your arm, neck, jaw, back, or abdomen.  You have shortness of breath.  You have an increasing cough, or you cough up blood.  You have severe back or abdominal pain.  You feel nauseous or vomit.  You have severe weakness.  You faint.  You have  chills. This is an emergency. Do not wait to see if the pain will go away. Get medical help at once. Call your local emergency services (911 in [place] Do not drive yourself to the hospital. MAKE SURE YOU:   Understand these instructions.  Will watch your condition.  Will get help right away if you are not doing well or get worse. Document Released: 09/23/2005 Document Revised: 12/19/2013 Document Reviewed: 07/19/2008 [facility] Patient Information 2015 [place], [place]. This information is not intended to replace advice given to you by your health care provider. Make sure you discuss any questions you have with your health care provider.

## 2016-09-11 ENCOUNTER — Telehealth: Payer: Self-pay

## 2016-09-11 NOTE — Telephone Encounter (Signed)
Pt is calling to see if he could have his TCS done in Dec. He is not due until 02/2017 but he would like to have it done early if his insurance will pay.

## 2016-09-21 NOTE — Telephone Encounter (Signed)
Talked with his wife and they are going to wait until there are due since insurance will not pay unless he is having a problem

## 2016-12-16 ENCOUNTER — Other Ambulatory Visit (HOSPITAL_COMMUNITY): Payer: Self-pay | Admitting: Internal Medicine

## 2016-12-16 ENCOUNTER — Ambulatory Visit (HOSPITAL_COMMUNITY)
Admission: RE | Admit: 2016-12-16 | Discharge: 2016-12-16 | Disposition: A | Discharge status: Home / Self Care | Payer: BLUE CROSS/BLUE SHIELD | Source: Ambulatory Visit | Attending: Internal Medicine | Admitting: Internal Medicine

## 2016-12-16 DIAGNOSIS — M47816 Spondylosis without myelopathy or radiculopathy, lumbar region: Secondary | ICD-10-CM | POA: Diagnosis not present

## 2016-12-16 DIAGNOSIS — M549 Dorsalgia, unspecified: Secondary | ICD-10-CM

## 2016-12-16 DIAGNOSIS — M47817 Spondylosis without myelopathy or radiculopathy, lumbosacral region: Secondary | ICD-10-CM | POA: Insufficient documentation

## 2017-02-04 ENCOUNTER — Encounter: Payer: Self-pay | Admitting: Internal Medicine

## 2017-03-03 ENCOUNTER — Telehealth: Payer: Self-pay

## 2017-03-03 ENCOUNTER — Telehealth: Payer: Self-pay | Admitting: Internal Medicine

## 2017-03-03 NOTE — Telephone Encounter (Signed)
LMOM for a return call.  

## 2017-03-03 NOTE — Telephone Encounter (Signed)
See separate triage.  

## 2017-03-03 NOTE — Telephone Encounter (Signed)
Pt's wife, Manuela Schwartz, called to set up patient's 5 year colonoscopy. He isn't having any GI issues and has [employer]. Please call [phone]

## 2017-03-16 ENCOUNTER — Other Ambulatory Visit: Payer: Self-pay

## 2017-03-16 DIAGNOSIS — Z86010 Personal history of colonic polyps: Secondary | ICD-10-CM

## 2017-03-16 NOTE — Telephone Encounter (Signed)
Gastroenterology Pre-Procedure Review  Request Date: 03/03/2017 Requesting Physician: ON RECALL  Last colonoscopy 02/24/2012/ Hx of adenomatous polyps  PATIENT REVIEW QUESTIONS: The patient responded to the following health history questions as indicated:    1. Diabetes Melitis: no 2. Joint replacements in the past 12 months: no 3. Major health problems in the past 3 months: no 4. Has an artificial valve or MVP: no 5. Has a defibrillator: no 6. Has been advised in past to take antibiotics in advance of a procedure like teeth cleaning: no 7. Family history of colon cancer: no  8. Alcohol Use: no 9. History of sleep apnea: no  10. History of coronary artery or other vascular stents placed within the last 12 months: no    MEDICATIONS & ALLERGIES:    Patient reports the following regarding taking any blood thinners:   Plavix? no Aspirin? no Coumadin? no Brilinta? no Xarelto? no Eliquis? no Pradaxa? no Savaysa? no Effient? no  Patient confirms/reports the following medications:  Current Outpatient Prescriptions  Medication Sig Dispense Refill  . atorvastatin (LIPITOR) 20 MG tablet Take 20 mg by mouth daily.    Marland Kitchen ibuprofen (ADVIL,MOTRIN) 200 MG tablet Take 600 mg by mouth every 8 (eight) hours as needed for headache or moderate pain.    . naproxen (NAPROSYN) 500 MG tablet Take 1 tablet (500 mg total) by mouth 2 (two) times daily. (Patient not taking: Reported on 03/03/2017) 30 tablet 0   No current facility-administered medications for this visit.     Patient confirms/reports the following allergies:  No Known Allergies  No orders of the defined types were placed in this encounter.   AUTHORIZATION INFORMATION Primary Insurance:   ID #:   Group #:  Pre-Cert / Auth required: Pre-Cert / Auth #:   Secondary Insurance:   ID #:   Group #:  Pre-Cert / Auth required: Pre-Cert / Auth #:   SCHEDULE INFORMATION: Procedure has been scheduled as follows:  Date:  04/22/2017             Time:  8:30 AM Location: [facility] Short Stay  This Gastroenterology Pre-Precedure Review Form is being routed to the following provider(s): R. Garfield Cornea, MD

## 2017-03-17 NOTE — Telephone Encounter (Signed)
Appropriate.

## 2017-03-17 NOTE — Telephone Encounter (Signed)
See separate triage.  

## 2017-03-18 MED ORDER — PEG 3350-KCL-NA BICARB-NACL 420 G PO SOLR: 4000.0000 mL | ORAL | 0 refills | Status: DC

## 2017-03-18 MED ORDER — NA SULFATE-K SULFATE-MG SULF 17.5-3.13-1.6 GM/177ML PO SOLN: 1.0000 | ORAL | 0 refills | Status: DC

## 2017-03-18 NOTE — Addendum Note (Signed)
Addended by: Everardo All on: 03/18/2017 09:47 AM   Modules accepted: Orders

## 2017-03-18 NOTE — Addendum Note (Signed)
Addended by: Everardo All on: 03/18/2017 11:38 AM   Modules accepted: Orders

## 2017-03-18 NOTE — Telephone Encounter (Signed)
Fax from pharmacy Suprep not covered by insurance. Sending in [place] and mailing new instructions. Discarded the instructions for the Suprep.

## 2017-03-18 NOTE — Telephone Encounter (Signed)
Rx sent to the pharmacy and instructions mailed to pt.  

## 2017-04-20 ENCOUNTER — Telehealth: Payer: Self-pay

## 2017-04-20 NOTE — Telephone Encounter (Signed)
LMOM to call back

## 2017-04-20 NOTE — Telephone Encounter (Signed)
NO PA is needed for TCS. Ref# Marnee Spring. 04/20/17 8:19 am

## 2017-04-22 ENCOUNTER — Encounter (HOSPITAL_COMMUNITY)
Admission: RE | Disposition: A | Discharge status: Home / Self Care | Payer: Self-pay | Source: Ambulatory Visit | Attending: Internal Medicine

## 2017-04-22 ENCOUNTER — Encounter (HOSPITAL_COMMUNITY): Payer: Self-pay | Admitting: *Deleted

## 2017-04-22 ENCOUNTER — Ambulatory Visit (HOSPITAL_COMMUNITY)
Admission: RE | Admit: 2017-04-22 | Discharge: 2017-04-22 | Disposition: A | Discharge status: Home / Self Care | Payer: BLUE CROSS/BLUE SHIELD | Source: Ambulatory Visit | Attending: Internal Medicine | Admitting: Internal Medicine

## 2017-04-22 DIAGNOSIS — E7800 Pure hypercholesterolemia, unspecified: Secondary | ICD-10-CM | POA: Diagnosis not present

## 2017-04-22 DIAGNOSIS — Z79899 Other long term (current) drug therapy: Secondary | ICD-10-CM | POA: Diagnosis not present

## 2017-04-22 DIAGNOSIS — Z1211 Encounter for screening for malignant neoplasm of colon: Secondary | ICD-10-CM | POA: Insufficient documentation

## 2017-04-22 DIAGNOSIS — Z86010 Personal history of colonic polyps: Secondary | ICD-10-CM | POA: Insufficient documentation

## 2017-04-22 DIAGNOSIS — Z85828 Personal history of other malignant neoplasm of skin: Secondary | ICD-10-CM | POA: Diagnosis not present

## 2017-04-22 DIAGNOSIS — D122 Benign neoplasm of ascending colon: Secondary | ICD-10-CM | POA: Insufficient documentation

## 2017-04-22 HISTORY — PX: COLONOSCOPY: SHX5424

## 2017-04-22 HISTORY — PX: POLYPECTOMY: SHX5525

## 2017-04-22 SURGERY — COLONOSCOPY
Anesthesia: Moderate Sedation

## 2017-04-22 MED ORDER — ONDANSETRON HCL 4 MG/2ML IJ SOLN
INTRAMUSCULAR | Status: DC | PRN
  Administered 2017-04-22: 4 mg via INTRAVENOUS

## 2017-04-22 MED ORDER — MIDAZOLAM HCL 5 MG/5ML IJ SOLN
INTRAMUSCULAR | Status: AC
  Filled 2017-04-22: qty 10

## 2017-04-22 MED ORDER — ONDANSETRON HCL 4 MG/2ML IJ SOLN
INTRAMUSCULAR | Status: AC
  Filled 2017-04-22: qty 2

## 2017-04-22 MED ORDER — STERILE WATER FOR IRRIGATION IR SOLN
Status: DC | PRN
  Administered 2017-04-22: 08:00:00

## 2017-04-22 MED ORDER — MEPERIDINE HCL 100 MG/ML IJ SOLN
INTRAMUSCULAR | Status: DC | PRN
  Administered 2017-04-22: 25 mg via INTRAVENOUS
  Administered 2017-04-22: 50 mg via INTRAVENOUS
  Administered 2017-04-22: 25 mg via INTRAVENOUS

## 2017-04-22 MED ORDER — MEPERIDINE HCL 100 MG/ML IJ SOLN
INTRAMUSCULAR | Status: AC
  Filled 2017-04-22: qty 2

## 2017-04-22 MED ORDER — SODIUM CHLORIDE 0.9 % IV SOLN
INTRAVENOUS | Status: DC
  Administered 2017-04-22: 08:00:00 via INTRAVENOUS

## 2017-04-22 MED ORDER — MIDAZOLAM HCL 5 MG/5ML IJ SOLN
INTRAMUSCULAR | Status: DC | PRN
  Administered 2017-04-22: 2 mg via INTRAVENOUS
  Administered 2017-04-22: 1 mg via INTRAVENOUS
  Administered 2017-04-22: 2 mg via INTRAVENOUS

## 2017-04-22 NOTE — Op Note (Signed)
[facility] Patient Name: Travis Warner Procedure Date: 04/22/2017 8:11 AM MRN: [id number] Date of Birth: November 15, 1961 Attending MD: Norvel Richards , MD CSN: [id number] Age: 56 Admit Type: Outpatient Procedure:                Colonoscopy Indications:              High risk colon cancer surveillance: Personal                            history of colonic polyps Providers:                Norvel Richards, MD, Otis Peak B. Gwenlyn Perking RN, RN,                            Randa Spike, Technician Referring MD:              Medicines:                Midazolam 5 mg IV, Meperidine 100 mg IV,                            Ondansetron 4 mg IV Complications:            No immediate complications. Estimated Blood Loss:     Estimated blood loss was minimal. Procedure:                Pre-Anesthesia Assessment:                           - Prior to the procedure, a History and Physical                            was performed, and patient medications and                            allergies were reviewed. The patient's tolerance of                            previous anesthesia was also reviewed. The risks                            and benefits of the procedure and the sedation                            options and risks were discussed with the patient.                            All questions were answered, and informed consent                            was obtained. Prior Anticoagulants: The patient has                            taken no previous anticoagulant or antiplatelet  agents. ASA Grade Assessment: II - A patient with                            mild systemic disease. After reviewing the risks                            and benefits, the patient was deemed in                            satisfactory condition to undergo the procedure.                           After obtaining informed consent, the colonoscope                            was passed under direct  vision. Throughout the                            procedure, the patient's blood pressure, pulse, and                            oxygen saturations were monitored continuously. The                            EC-[id number] ([id number]) scope was introduced through                            the anus and advanced to the the cecum, identified                            by appendiceal orifice and ileocecal valve. The                            colonoscopy was performed without difficulty. The                            patient tolerated the procedure well. The entire                            colon was well visualized. The quality of the bowel                            preparation was adequate. The ileocecal valve,                            appendiceal orifice, and rectum were photographed. Scope In: 8:28:45 AM Scope Out: 8:43:11 AM Scope Withdrawal Time: 0 hours 10 minutes 50 seconds  Total Procedure Duration: 0 hours 14 minutes 26 seconds  Findings:      The perianal and digital rectal examinations were normal.      Two semi-pedunculated polyps were found in the ascending colon. The       polyps were 4 to 7 mm in size. These polyps were removed with a cold  snare. Resection and retrieval were complete. Estimated blood loss was       minimal.      The exam was otherwise without abnormality.      The exam was otherwise without abnormality on direct and retroflexion       views. Impression:               - Two 4 to 7 mm polyps in the ascending colon,                            removed with a cold snare. Resected and retrieved.                           - The examination was otherwise normal on direct                            and retroflexion views. Moderate Sedation:      Moderate (conscious) sedation was administered by the endoscopy nurse       and supervised by the endoscopist. The following parameters were       monitored: oxygen saturation, heart rate, blood pressure, respiratory        rate, EKG, adequacy of pulmonary ventilation, and response to care.       Total physician intraservice time was 24 minutes. Recommendation:           - Patient has a contact number available for                            emergencies. The signs and symptoms of potential                            delayed complications were discussed with the                            patient. Return to normal activities tomorrow.                            Written discharge instructions were provided to the                            patient.                           - Resume previous diet.                           - Continue present medications.                           - Await pathology results.                           - Repeat colonoscopy date to be determined after                            pending pathology results are reviewed for  surveillance based on pathology results.                           - Return to GI clinic (date not yet determined). Procedure Code(s):        --- Professional ---                           [phone], Colonoscopy, flexible; with removal of                            tumor(s), polyp(s), or other lesion(s) by snare                            technique                           99152, Moderate sedation services provided by the                            same physician or other qualified health care                            professional performing the diagnostic or                            therapeutic service that the sedation supports,                            requiring the presence of an independent trained                            observer to assist in the monitoring of the                            patient's level of consciousness and physiological                            status; initial 15 minutes of intraservice time,                            patient age 32 years or older                           [phone], Moderate sedation  services; each additional                            15 minutes intraservice time Diagnosis Code(s):        --- Professional ---                           Z86.010, Personal history of colonic polyps                           D12.2, Benign neoplasm of ascending colon CPT copyright 2016 American [employer]. All rights reserved. The codes documented in  this report are preliminary and upon coder review may  be revised to meet current compliance requirements. Cristopher Estimable. Earsel Shouse, MD Norvel Richards, MD 04/22/2017 8:49:54 AM This report has been signed electronically. Number of Addenda: 0

## 2017-04-22 NOTE — H&P (Signed)
@LOGO @   Primary Care Physician:  Purvis Kilts, MD Primary Gastroenterologist:  Dr. Gala Romney  Pre-Procedure History & Physical: HPI:  Travis Warner is a 56 y.o. male here for surveillance colonoscopy. History of colonic adenomas removed 2013. No bowel symptoms. Here for surveillance examination.  Past Medical History:  Diagnosis Date  . Cancer ([place])    skin cancer- basal cell on face  . Chronic kidney disease    kidney stone  . Hypercholesterolemia   . Insomnia     Past Surgical History:  Procedure Laterality Date  . COLONOSCOPY  02/24/2012   Procedure: COLONOSCOPY;  Surgeon: Daneil Dolin, MD;  Location: [facility];  Service: Endoscopy;  Laterality: N/A;  11:30 AM  . NO PAST SURGERIES    . Skin cancer removed from face      Prior to Admission medications   Medication Sig Start Date End Date Taking? Authorizing Provider  Artificial Tear Ointment (DRY EYES OP) Place 1 drop into both eyes 2 (two) times daily as needed (dry eyes).   Yes Historical Provider, MD  polyethylene glycol-electrolytes (TRILYTE) 420 g solution Take 4,000 mLs by mouth as directed. 03/18/17  Yes Daneil Dolin, MD  pravastatin (PRAVACHOL) 40 MG tablet Take 40 mg by mouth daily. 03/31/17  Yes Historical Provider, MD  Na Sulfate-K Sulfate-Mg Sulf (SUPREP BOWEL PREP KIT) 17.5-3.13-1.6 GM/180ML SOLN Take 1 kit by mouth as directed. 03/18/17   Daneil Dolin, MD  naproxen (NAPROSYN) 500 MG tablet Take 1 tablet (500 mg total) by mouth 2 (two) times daily. Patient not taking: Reported on 03/03/2017 02/07/15   Ezequiel Essex, MD    Allergies as of 03/16/2017  . (No Known Allergies)    Family History  Problem Relation Age of Onset  . COPD Mother   . Liver cancer Father   . Colon cancer Neg Hx     Social History   Social History  . Marital status: Married    Spouse name: N/A  . Number of children: N/A  . Years of education: N/A   Occupational History  . Not on file.   Social History Main Topics   . Smoking status: Never Smoker  . Smokeless tobacco: Never Used  . Alcohol use No  . Drug use: No  . Sexual activity: Yes   Other Topics Concern  . Not on file   Social History Narrative  . No narrative on file    Review of Systems: See HPI, otherwise negative ROS  Physical Exam: BP (!) 158/78   Pulse 72   Temp 98 F (36.7 C) (Oral)   Resp 18   Ht 5' 9"  (1.753 m)   Wt 170 lb (77.1 kg)   SpO2 98%   BMI 25.10 kg/m  General:   Alert,  Well-developed, well-nourished, pleasant and cooperative in NAD Neck:  Supple; no masses or thyromegaly. No significant cervical adenopathy. Lungs:  Clear throughout to auscultation.   No wheezes, crackles, or rhonchi. No acute distress. Heart:  Regular rate and rhythm; no murmurs, clicks, rubs,  or gallops. Abdomen: Non-distended, normal bowel sounds.  Soft and nontender without appreciable mass or hepatosplenomegaly.  Pulses:  Normal pulses noted. Extremities:  Without clubbing or edema.  Impression:   Pleasant 56 year old gentleman with history of colonic adenoma; no bowel symptoms currently. Here for some plans colonoscopy.   Recommendations:  Plan for surveillance colonoscopy today.  The risks, benefits, limitations, alternatives and imponderables have been reviewed with the patient. Questions have been  answered. All parties are agreeable.     Notice: This dictation was prepared with Dragon dictation along with smaller phrase technology. Any transcriptional errors that result from this process are unintentional and may not be corrected upon review.

## 2017-04-22 NOTE — Discharge Instructions (Addendum)
Colon Polyps Polyps are tissue growths inside the body. Polyps can grow in many places, including the large intestine (colon). A polyp may be a round bump or a mushroom-shaped growth. You could have one polyp or several. Most colon polyps are noncancerous (benign). However, some colon polyps can become cancerous over time. What are the causes? The exact cause of colon polyps is not known. What increases the risk? This condition is more likely to develop in people who:  Have a family history of colon cancer or colon polyps.  Are older than 94 or older than 45 if they are African American.  Have inflammatory bowel disease, such as ulcerative colitis or Crohn disease.  Are overweight.  Smoke cigarettes.  Do not get enough exercise.  Drink too much alcohol.  Eat a diet that is:  High in fat and red meat.  Low in fiber.  Had childhood cancer that was treated with abdominal radiation. What are the signs or symptoms? Most polyps do not cause symptoms. If you have symptoms, they may include:  Blood coming from your rectum when having a bowel movement.  Blood in your stool.The stool may look dark red or black.  A change in bowel habits, such as constipation or diarrhea. How is this diagnosed? This condition is diagnosed with a colonoscopy. This is a procedure that uses a lighted, flexible scope to look at the inside of your colon. How is this treated? Treatment for this condition involves removing any polyps that are found. Those polyps will then be tested for cancer. If cancer is found, your health care provider will talk to you about options for colon cancer treatment. Follow these instructions at home: Diet   Eat plenty of fiber, such as fruits, vegetables, and whole grains.  Eat foods that are high in calcium and vitamin D, such as milk, cheese, yogurt, eggs, liver, fish, and broccoli.  Limit foods high in fat, red meats, and processed meats, such as hot dogs, sausage,  bacon, and lunch meats.  Maintain a healthy weight, or lose weight if recommended by your health care provider. General instructions   Do not smoke cigarettes.  Do not drink alcohol excessively.  Keep all follow-up visits as told by your health care provider. This is important. This includes keeping regularly scheduled colonoscopies. Talk to your health care provider about when you need a colonoscopy.  Exercise every day or as told by your health care provider. Contact a health care provider if:  You have new or worsening bleeding during a bowel movement.  You have new or increased blood in your stool.  You have a change in bowel habits.  You unexpectedly lose weight. This information is not intended to replace advice given to you by your health care provider. Make sure you discuss any questions you have with your health care provider. Document Released: 09/09/2004 Document Revised: 05/21/2016 Document Reviewed: 11/04/2015 Elsevier Interactive Patient Education  2017 [place].  Colonoscopy Discharge Instructions  Read the instructions outlined below and refer to this sheet in the next few weeks. These discharge instructions provide you with general information on caring for yourself after you leave the hospital. Your doctor may also give you specific instructions. While your treatment has been planned according to the most current medical practices available, unavoidable complications occasionally occur. If you have any problems or questions after discharge, call Dr. Gala Romney at [phone]. ACTIVITY  You may resume your regular activity, but move at a slower pace for the next 24  hours.   Take frequent rest periods for the next 24 hours.   Walking will help get rid of the air and reduce the bloated feeling in your belly (abdomen).   No driving for 24 hours (because of the medicine (anesthesia) used during the test).    Do not sign any important legal documents or operate any  machinery for 24 hours (because of the anesthesia used during the test).  NUTRITION  Drink plenty of fluids.   You may resume your normal diet as instructed by your doctor.   Begin with a light meal and progress to your normal diet. Heavy or fried foods are harder to digest and may make you feel sick to your stomach (nauseated).   Avoid alcoholic beverages for 24 hours or as instructed.  MEDICATIONS  You may resume your normal medications unless your doctor tells you otherwise.  WHAT YOU CAN EXPECT TODAY  Some feelings of bloating in the abdomen.   Passage of more gas than usual.   Spotting of blood in your stool or on the toilet paper.  IF YOU HAD POLYPS REMOVED DURING THE COLONOSCOPY:  No aspirin products for 7 days or as instructed.   No alcohol for 7 days or as instructed.   Eat a soft diet for the next 24 hours.  FINDING OUT THE RESULTS OF YOUR TEST Not all test results are available during your visit. If your test results are not back during the visit, make an appointment with your caregiver to find out the results. Do not assume everything is normal if you have not heard from your caregiver or the medical facility. It is important for you to follow up on all of your test results.  SEEK IMMEDIATE MEDICAL ATTENTION IF:  You have more than a spotting of blood in your stool.   Your belly is swollen (abdominal distention).   You are nauseated or vomiting.   You have a temperature over 101.   You have abdominal pain or discomfort that is severe or gets worse throughout the day.   Colon polyp information provided  Further recommendations to follow pending review of pathology report

## 2017-04-27 ENCOUNTER — Encounter: Payer: Self-pay | Admitting: Internal Medicine

## 2017-04-27 ENCOUNTER — Encounter (HOSPITAL_COMMUNITY): Payer: Self-pay | Admitting: Internal Medicine

## 2019-02-17 ENCOUNTER — Other Ambulatory Visit (HOSPITAL_COMMUNITY): Payer: Self-pay | Admitting: Family Medicine

## 2019-02-17 ENCOUNTER — Ambulatory Visit (HOSPITAL_COMMUNITY)
Admission: RE | Admit: 2019-02-17 | Discharge: 2019-02-17 | Disposition: A | Discharge status: Home / Self Care | Payer: BLUE CROSS/BLUE SHIELD | Source: Ambulatory Visit | Attending: Family Medicine | Admitting: Family Medicine

## 2019-02-17 DIAGNOSIS — M5440 Lumbago with sciatica, unspecified side: Secondary | ICD-10-CM | POA: Diagnosis present

## 2019-03-07 ENCOUNTER — Emergency Department (HOSPITAL_COMMUNITY): Payer: BLUE CROSS/BLUE SHIELD

## 2019-03-07 ENCOUNTER — Encounter (HOSPITAL_COMMUNITY): Payer: Self-pay | Admitting: Emergency Medicine

## 2019-03-07 ENCOUNTER — Other Ambulatory Visit: Payer: Self-pay

## 2019-03-07 ENCOUNTER — Emergency Department (HOSPITAL_COMMUNITY)
Admission: EM | Admit: 2019-03-07 | Discharge: 2019-03-07 | Disposition: A | Discharge status: Home / Self Care | Payer: BLUE CROSS/BLUE SHIELD | Attending: Emergency Medicine | Admitting: Emergency Medicine

## 2019-03-07 DIAGNOSIS — R0789 Other chest pain: Secondary | ICD-10-CM | POA: Diagnosis not present

## 2019-03-07 DIAGNOSIS — Z85828 Personal history of other malignant neoplasm of skin: Secondary | ICD-10-CM | POA: Diagnosis not present

## 2019-03-07 DIAGNOSIS — R079 Chest pain, unspecified: Secondary | ICD-10-CM

## 2019-03-07 DIAGNOSIS — Z79899 Other long term (current) drug therapy: Secondary | ICD-10-CM | POA: Diagnosis not present

## 2019-03-07 LAB — BASIC METABOLIC PANEL
Anion gap: 9 (ref 5–15)
BUN: 21 mg/dL — ABNORMAL HIGH (ref 6–20)
CO2: 27 mmol/L (ref 22–32)
Calcium: 9.8 mg/dL (ref 8.9–10.3)
Chloride: 105 mmol/L (ref 98–111)
Creatinine, Ser: 1.12 mg/dL (ref 0.61–1.24)
GFR calc Af Amer: >60 mL/min (ref >60)
GFR calc non Af Amer: >60 mL/min (ref >60)
Glucose, Bld: 106 mg/dL — ABNORMAL HIGH (ref 70–99)
Potassium: 4.6 mmol/L (ref 3.5–5.1)
Sodium: 141 mmol/L (ref 135–145)

## 2019-03-07 LAB — CBC
HCT: 43.1 % (ref 39.0–52.0)
Hemoglobin: 14.2 g/dL (ref 13.0–17.0)
MCH: 30.2 pg (ref 26.0–34.0)
MCHC: 32.9 g/dL (ref 30.0–36.0)
MCV: 91.7 fL (ref 80.0–100.0)
Platelets: 221 10*3/uL (ref 150–400)
RBC: 4.7 MIL/uL (ref 4.22–5.81)
RDW: 12.7 % (ref 11.5–15.5)
WBC: 4.8 10*3/uL (ref 4.0–10.5)
nRBC: 0 % (ref 0.0–0.2)

## 2019-03-07 LAB — TROPONIN I: Troponin I: <0.03 ng/mL (ref <0.03)

## 2019-03-07 MED ORDER — NAPROXEN 500 MG PO TABS: 500.0000 mg | ORAL_TABLET | Freq: Two times a day (BID) | ORAL | 0 refills | Status: DC | PRN

## 2019-03-07 MED ORDER — ASPIRIN 81 MG PO CHEW
324.0000 mg | CHEWABLE_TABLET | Freq: Once | ORAL | Status: AC
  Administered 2019-03-07: 324 mg via ORAL
  Filled 2019-03-07: qty 4

## 2019-03-07 NOTE — ED Triage Notes (Addendum)
C/o left shoulder (dull, 6/10) about 2 days about then chest pain few hours after (sharp 5/10).  Denies SOB or any other symptoms.  C/o abdominal bloating for lat 2 weeks.  Last BM this am (normal).  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

## 2019-03-07 NOTE — Discharge Instructions (Addendum)
Your lab tests, EKG and chest x-ray are normal today.  Your symptoms are unlikely to be related to heart disease and more likely to be a musculoskeletal source.  However your doctor has referred you to a cardiologist as discussed, it will be important for you to follow-up with this referral.  In the interim taking a baby aspirin daily would be heart protective.  Return here for any worsening symptoms as discussed.

## 2019-03-07 NOTE — ED Provider Notes (Signed)
[facility] EMERGENCY DEPARTMENT Provider Note   CSN: [id number] Arrival date & time: 03/07/19  0737    History   Chief Complaint Chief Complaint  Patient presents with  . Chest Pain    HPI Travis Warner is a 58 y.o. male.     The history is provided by the patient.  Chest Pain  Pain location:  L chest Pain quality: aching and dull   Pain radiates to:  L shoulder Pain severity:  Mild Onset quality:  Unable to specify Duration:  2 days Timing:  Constant Progression:  Unchanged Chronicity:  New Context: at rest   Context: not breathing, not drug use, not eating, not movement, not raising an arm, not stress and not trauma   Context comment:  Constant discomfort which is not worsened with exertion or improved at rest. Relieved by: Tylenol seemed to improve his symptoms temporarily. Worsened by:  Nothing Associated symptoms: palpitations   Associated symptoms: no abdominal pain, no anorexia, no anxiety, no back pain, no cough, no diaphoresis, no dizziness, no dysphagia, no fever, no headache, no heartburn, no lower extremity edema, no nausea, no near-syncope, no numbness, no orthopnea, no shortness of breath, no syncope, no vomiting and no weakness   Associated symptoms comment:  He has noticed transient fleeting palpitations occurring 1-2 times a week for the past month or more.  He has discussed this with his PCP and has been referred to cardiology for an exercise stress test.  He has not had this symptom of palpitations this week.  He also reports has had an intentional 9 pound weight loss by restricting carbs.  He also reports routinely walking on a treadmill at home without any chest discomfort, shortness of breath or exercise intolerance. Risk factors: high cholesterol and male sex   Risk factors: no aortic disease, no coronary artery disease, no diabetes mellitus, no hypertension and no smoking    Patient is a non-smoker.  No significant family history of coronary  disease.  Past Medical History:  Diagnosis Date  . Cancer ([place])    skin cancer- basal cell on face  . Chronic kidney disease    kidney stone  . Hypercholesterolemia   . Insomnia     Patient Active Problem List   Diagnosis Date Noted  . FOOT PAIN, RIGHT 03/07/2009    Past Surgical History:  Procedure Laterality Date  . COLONOSCOPY  02/24/2012   Procedure: COLONOSCOPY;  Surgeon: Daneil Dolin, MD;  [facility];  Service: Endoscopy;  Laterality: N/A;  11:30 AM  . COLONOSCOPY N/A 04/22/2017   Procedure: COLONOSCOPY;  Surgeon: Daneil Dolin, MD;  [facility];  Service: Endoscopy;  Laterality: N/A;  8:30 AM  . NO PAST SURGERIES    . POLYPECTOMY  04/22/2017   Procedure: POLYPECTOMY;  Surgeon: Daneil Dolin, MD;  [facility];  Service: Endoscopy;;  colon  . Skin cancer removed from face          Home Medications    Prior to Admission medications   Medication Sig Start Date End Date Taking? Authorizing Provider  ALPRAZolam Duanne Moron) 1 MG tablet Take 1 tablet by mouth as needed. 02/17/19  Yes [provider]  co-enzyme Q-10 30 MG capsule Take 30 mg by mouth daily.   Yes [provider]  pravastatin (PRAVACHOL) 40 MG tablet Take 40 mg by mouth daily. 03/31/17  Yes [provider]  zolpidem (AMBIEN) 10 MG tablet Take 1 tablet by mouth  at bedtime as needed. 02/17/19  Yes [provider]  naproxen (NAPROSYN) 500 MG tablet Take 1 tablet (500 mg total) by mouth 2 (two) times daily as needed for mild pain. 03/07/19   Evalee Jefferson, PA-C    Family History Family History  Problem Relation Age of Onset  . COPD Mother   . Liver cancer Father   . Colon cancer Neg Hx     Social History Social History   Tobacco Use  . Smoking status: Never Smoker  . Smokeless tobacco: Never Used  Substance Use Topics  . Alcohol use: No  . Drug use: No     Allergies   Patient has no known allergies.   Review of Systems Review of  Systems  Constitutional: Negative for diaphoresis and fever.  HENT: Negative for congestion, sore throat and trouble swallowing.   Eyes: Negative.   Respiratory: Negative for cough, chest tightness and shortness of breath.   Cardiovascular: Positive for chest pain and palpitations. Negative for orthopnea, syncope and near-syncope.  Gastrointestinal: Negative for abdominal pain, anorexia, heartburn, nausea and vomiting.  Genitourinary: Negative.   Musculoskeletal: Negative for arthralgias, back pain, joint swelling and neck pain.  Skin: Negative.  Negative for rash and wound.  Neurological: Negative for dizziness, weakness, light-headedness, numbness and headaches.  Psychiatric/Behavioral: Negative.      Physical Exam Updated Vital Signs BP 130/77   Pulse 76   Temp 98 F (36.7 C) (Oral)   Resp 16   Ht 5\' 9"  (1.753 m)   Wt 77.1 kg   SpO2 100%   BMI 25.10 kg/m   Physical Exam Vitals signs and nursing note reviewed.  Constitutional:      Appearance: He is well-developed.  HENT:     Head: Normocephalic and atraumatic.  Eyes:     Conjunctiva/sclera: Conjunctivae normal.  Neck:     Musculoskeletal: Normal range of motion.  Cardiovascular:     Rate and Rhythm: Normal rate and regular rhythm.     Heart sounds: Normal heart sounds.  Pulmonary:     Effort: Pulmonary effort is normal.     Breath sounds: Normal breath sounds. No wheezing.  Abdominal:     General: Bowel sounds are normal.     Palpations: Abdomen is soft.     Tenderness: There is no abdominal tenderness.  Musculoskeletal: Normal range of motion.     Right lower leg: No edema.     Left lower leg: No edema.  Skin:    General: Skin is warm and dry.  Neurological:     Mental Status: He is alert.      ED Treatments / Results  Labs (all labs ordered are listed, but only abnormal results are displayed) Labs Reviewed  BASIC METABOLIC PANEL - Abnormal; Notable for the following components:      Result Value    Glucose, Bld 106 (*)    BUN 21 (*)    All other components within normal limits  CBC  TROPONIN I    EKG EKG Interpretation  Date/Time:  Tuesday March 07 2019 09:53:02 EDT Ventricular Rate:  71 PR Interval:    QRS Duration: 88 QT Interval:  381 QTC Calculation: 414 R Axis:   79 Text Interpretation:  Normal sinus rhythm Normal ECG Confirmed by Veryl Speak [phone]) on 03/07/2019 9:56:40 AM   Radiology Dg Chest Portable 1 View  Result Date: 03/07/2019 CLINICAL DATA:  Chest pain for several days EXAM: PORTABLE CHEST 1 VIEW COMPARISON:  February 07, 2015  FINDINGS: No edema or consolidation. Heart size and pulmonary vascularity are normal. No adenopathy. No pneumothorax. No bone lesions. IMPRESSION: No edema or consolidation. Electronically Signed   By: Lowella Grip III M.D.   On: 03/07/2019 11:01    Procedures Procedures (including critical care time)  Medications Ordered in ED Medications  aspirin chewable tablet 324 mg (324 mg Oral Given 03/07/19 1052)     Initial Impression / Assessment and Plan / ED Course  I have reviewed the triage vital signs and the nursing notes.  Pertinent labs & imaging results that were available during my care of the patient were reviewed by me and considered in my medical decision making (see chart for details).        Patient with nonspecific left shoulder and chest pain with a normal cardiac work-up, EKG and troponin.  He has low risk with a heart score of 2.  He is already been referred to cardiology due to fleeting episodic palpitations.  Review of the monitor during his visit today revealed no ectopy.  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  This could very well be a deep muscular or chest wall pain.  Symptoms present x2 days with a negative troponin suggest this is not cardiac.  Encouraged to follow-up with cardiology as he has been referred by his PCP.   Return precautions discussed.  Final Clinical Impressions(s) / ED Diagnoses   Final diagnoses:  Nonspecific chest pain    ED Discharge Orders         Ordered    naproxen (NAPROSYN) 500 MG tablet  2 times daily PRN     03/07/19 1232           Evalee Jefferson, PA-C 03/07/19 1236    Veryl Speak, MD 03/08/19 [phone]

## 2019-03-27 ENCOUNTER — Telehealth (INDEPENDENT_AMBULATORY_CARE_PROVIDER_SITE_OTHER): Payer: BLUE CROSS/BLUE SHIELD | Admitting: Cardiology

## 2019-03-27 DIAGNOSIS — R002 Palpitations: Secondary | ICD-10-CM

## 2019-03-27 DIAGNOSIS — R0789 Other chest pain: Secondary | ICD-10-CM

## 2019-03-27 NOTE — Patient Instructions (Signed)
Medication Instructions:  Your physician recommends that you continue on your current medications as directed. Please refer to the Current Medication list given to you today.   Labwork: None   Testing/Procedures: none  Follow-Up: Your physician recommends that you schedule a follow-up appointment in: 4 months   Any Other Special Instructions Will Be Listed Below (If Applicable).     If you need a refill on your cardiac medications before your next appointment, please call your pharmacy.   

## 2019-03-27 NOTE — Progress Notes (Signed)
Virtual Visit via Telephone    Evaluation Performed:  New consult visit  This visit type was conducted due to national recommendations for restrictions regarding the COVID-19 Pandemic (e.g. social distancing).  This format is felt to be most appropriate for this patient at this time.  All issues noted in this document were discussed and addressed.  No physical exam was performed (except for noted visual exam findings with Video Visits).  Please refer to the patient's chart (MyChart message for video visits and phone note for telephone visits) for the patient's consent to telehealth for [facility].  Date:  03/27/2019   ID:  Travis Warner, DOB 10-13-1961, MRN [id number]  Patient Location:  [name], [place]  Provider location:   [place], [place]  PCP:  Sharilyn Sites, MD  Cardiologist:  No primary care provider on file.  Electrophysiologist:  None   Chief Complaint:  Palpitations  History of Present Illness:    Travis Warner is a 58 y.o. male who presents via audio/video conferencing for a telehealth visit today.  Referred by PA Glennon Mac for history of palpitations.   The patient does not symptoms concerning for COVID-19 infection (fever, chills, cough, or new shortness of breath).     1. Palpitations - occasional symptoms, mostly at time. No other associated symptoms. Lasts just a few seconds. Few times a week, started a few month ago - no sodas, no coffee, no tea. No EtoH - walks regularly, No other    2. Chest pain - ER visit 02/2019 - Trop neg x 1. CXR no acute process. EKG NSR, no ischemic changes  - pain started few days prior to ER visit. Dull pain left sided. Occur at rest or exertion. 5/10 in severity, no other associated symptoms. Down left arm. Not positional. Would last 3-4 hours. Lasted about 3 days after ER visit - better with tylenol - works in daycare, does frequent lifting.    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      Past Medical History:  Diagnosis Date  . Cancer ([place])    skin cancer- basal cell on face  . Chronic kidney disease    kidney stone  . Hypercholesterolemia   . Insomnia    Past Surgical History:  Procedure Laterality Date  . COLONOSCOPY  02/24/2012   Procedure: COLONOSCOPY;  Surgeon: Daneil Dolin, MD;  [facility];  Service: Endoscopy;  Laterality: N/A;  11:30 AM  . COLONOSCOPY N/A 04/22/2017   Procedure: COLONOSCOPY;  Surgeon: Daneil Dolin, MD;  [facility];  Service: Endoscopy;  Laterality: N/A;  8:30 AM  . NO PAST SURGERIES    . POLYPECTOMY  04/22/2017   Procedure: POLYPECTOMY;  Surgeon: Daneil Dolin, MD;  [facility];  Service: Endoscopy;;  colon  . Skin cancer removed from face       No outpatient medications have been marked as taking for the 03/27/19 encounter (Appointment) with Arnoldo Lenis, MD.     Allergies:   Patient has no known allergies.   Social History   Tobacco Use  . Smoking status: Never Smoker  . Smokeless tobacco: Never Used  Substance Use Topics  . Alcohol use: No  . Drug use: No     Family Hx: The patient's family history includes COPD in his mother; Liver cancer in his father. There is no history of Colon  cancer.  ROS:   Please see the history of present illness.     All other systems reviewed and are negative.   Labs/Other Tests and Data Reviewed:    Recent Labs: 03/07/2019: BUN 21; Creatinine, Ser 1.12; Hemoglobin 14.2; Platelets 221; Potassium 4.6; Sodium 141   Recent Lipid Panel No results found for: CHOL, TRIG, HDL, CHOLHDL, LDLCALC, LDLDIRECT  Wt Readings from Last 3 Encounters:  03/07/19 170 lb (77.1 kg)  04/22/17 170 lb (77.1 kg)  02/07/15 158 lb (71.7 kg)     Exam:    Vital Signs:  There were no vitals taken for this visit.   Normal affect, normal speech patterns  ASSESSMENT & PLAN:    1.  Palpitations - mild symptoms  only at night, short in duration - normal EKG during recent ER visit - monitor symptoms at this time, we discussed possible event monitor but given how mild his symptoms are have elected to hold off at this time.      2. Chest pain - atypical symptoms that have resolved, negative workup in ER - continue to monitor at this time, do not see indication for further testing currently.      COVID-19 Education: The signs and symptoms of COVID-19 were discussed with the patient and how to seek care for testing (follow up with PCP or arrange E-visit).  The importance of social distancing was discussed today.  Patient Risk:   After full review of this patients clinical status, I feel that they are at least moderate risk at this time.  Time:   Today, I have spent 30 minutes with the patient with telehealth technology discussing palpitatoins and chest pain.     Medication Adjustments/Labs and Tests Ordered: Current medicines are reviewed at length with the patient today.  Concerns regarding medicines are outlined above.  Tests Ordered: No orders of the defined types were placed in this encounter.  Medication Changes: No orders of the defined types were placed in this encounter.   Disposition:  Follow up 4 months, if no recurrence of symptoms at that time can reschedule  Signed, Carlyle Dolly, MD  03/27/2019 8:52 AM    [facility]

## 2019-03-29 ENCOUNTER — Ambulatory Visit: Payer: BLUE CROSS/BLUE SHIELD | Admitting: Cardiology

## 2019-04-17 ENCOUNTER — Encounter: Payer: Self-pay | Admitting: Nurse Practitioner

## 2019-04-17 ENCOUNTER — Ambulatory Visit (INDEPENDENT_AMBULATORY_CARE_PROVIDER_SITE_OTHER): Payer: BLUE CROSS/BLUE SHIELD | Admitting: Nurse Practitioner

## 2019-04-17 ENCOUNTER — Other Ambulatory Visit: Payer: Self-pay

## 2019-04-17 ENCOUNTER — Encounter: Payer: Self-pay | Admitting: Internal Medicine

## 2019-04-17 DIAGNOSIS — R109 Unspecified abdominal pain: Secondary | ICD-10-CM | POA: Insufficient documentation

## 2019-04-17 DIAGNOSIS — R1084 Generalized abdominal pain: Secondary | ICD-10-CM

## 2019-04-17 DIAGNOSIS — K5900 Constipation, unspecified: Secondary | ICD-10-CM | POA: Diagnosis not present

## 2019-04-17 DIAGNOSIS — K219 Gastro-esophageal reflux disease without esophagitis: Secondary | ICD-10-CM | POA: Insufficient documentation

## 2019-04-17 MED ORDER — OMEPRAZOLE 40 MG PO CPDR: 40.0000 mg | DELAYED_RELEASE_CAPSULE | Freq: Every day | ORAL | 3 refills | Status: DC

## 2019-04-17 NOTE — Assessment & Plan Note (Signed)
The patient noted left lower abdominal pain and previously had hard stools.  Likely an element of constipation as per above.  Further constipation management as per above.  In addition, the patient denies "overt pain" but does have "a feeling" into his chest and esophagus.  This is associated with bloating.  Over-the-counter antacids have helped.  Likely an element of GERD.  Further GERD management as per below.  Follow-up in 3 months and call if any worsening or severe symptoms.

## 2019-04-17 NOTE — Progress Notes (Signed)
cc'ed to pcp °

## 2019-04-17 NOTE — Progress Notes (Signed)
Referring Provider: Sharilyn Sites, MD Primary Care Physician:  Sharilyn Sites, MD Primary GI:  Dr. Gala Romney  NOTE: Service was provided via telemedicine and was requested by the patient due to COVID-19 pandemic.  Method of visit: FaceTime  Patient Location: Home  Provider Location: Office  Reason for Phone Visit: Abdominal swelling  The patient was consented to phone follow-up via telephone encounter including billing of the encounter (yes/no): Yes  Persons present on the phone encounter, with roles: None  Total time (minutes) spent on medical discussion: 16 minutes  Chief Complaint  Patient presents with  . Abdominal Pain    bloating,pressure in chest near breast bone,heartburn    HPI:   Travis Warner is a 58 y.o. male who presents for virtual visit regarding: Abdominal swelling.  Patient was last seen in our practice 04/22/2017 for colonoscopy.  Noted history of colon polyps and subsequently high risk colon cancer surveillance.  2 semi-pedunculated polyps were found in the ascending colon measuring between 4 and 7 mm in size, otherwise normal.  Surgical pathology found the polyps to be sessile serrated polyp/adenoma.  Recommended 3-year repeat exam (2021).  Today he states he's doing ok overall. Had routine bloodwork in February and had back pain and was dx DDD on XRay. Still hurting back. Bloodwork normal. Saw PCP April 2 with bloating and left-sided abdominal pain and was given empiric abx for diverticulitis. Bloodwork redone which abnormal kidney labs. (labs are not available to me), was in the ER 03/07/19 for chest pain and EKG normal. Labs normal and was d/c to home. Told by cardiology possible pulled muscle. Was given metamucil and probiotics. Pain is still persistent (but not as bad) left-sided abdomen mid to lower abdomen. Has a bowel movement daily. Stools were hard but since starting Metamucil stools are soft, no straining, no sensation of incomplete emptying. Has a  "feeling, but not a pain" up into his chest. "The feeling" tends to occur at random or when laying (occasionally after eating) and associated with bloating. Does have heartburn symptoms with that, has never had it before. Has used OTC antacids which helped his symptoms. Denies N/V, hematochezia, melena, fevers, chills, unintentional weight loss. Denies URI and flu-like symptoms. Denies chest pain, dyspnea, dizziness, lightheadedness, syncope, near syncope. Denies any other upper or lower GI symptoms.  Was previously on Naproxen, but not in a while. Denies other NSAIDs and ASA powders.  Past Medical History:  Diagnosis Date  . Cancer ([place])    skin cancer- basal cell on face  . Chronic kidney disease    kidney stone  . Hypercholesterolemia   . Insomnia     Past Surgical History:  Procedure Laterality Date  . COLONOSCOPY  02/24/2012   Procedure: COLONOSCOPY;  Surgeon: Daneil Dolin, MD;  [facility];  Service: Endoscopy;  Laterality: N/A;  11:30 AM  . COLONOSCOPY N/A 04/22/2017   Procedure: COLONOSCOPY;  Surgeon: Daneil Dolin, MD;  [facility];  Service: Endoscopy;  Laterality: N/A;  8:30 AM  . NO PAST SURGERIES    . POLYPECTOMY  04/22/2017   Procedure: POLYPECTOMY;  Surgeon: Daneil Dolin, MD;  [facility];  Service: Endoscopy;;  colon  . Skin cancer removed from face      Current Outpatient Medications  Medication Sig Dispense Refill  . ALPRAZolam (XANAX) 1 MG tablet Take 1 tablet by mouth as needed.    Marland Kitchen co-enzyme Q-10 30 MG capsule Take 30 mg by mouth  daily.    . pravastatin (PRAVACHOL) 40 MG tablet Take 40 mg by mouth daily.  0  . zolpidem (AMBIEN) 10 MG tablet Take 1 tablet by mouth at bedtime as needed.    Marland Kitchen omeprazole (PRILOSEC) 40 MG capsule Take 1 capsule (40 mg total) by mouth daily. 30 capsule 3   No current facility-administered medications for this visit.     Allergies as of 04/17/2019  . (No Known Allergies)    Family History   Problem Relation Age of Onset  . COPD Mother   . Liver cancer Father   . Colon cancer Neg Hx     Social History   Socioeconomic History  . Marital status: Married    Spouse name: Not on file  . Number of children: Not on file  . Years of education: Not on file  . Highest education level: Not on file  Occupational History  . Not on file  Social Needs  . Financial resource strain: Not on file  . Food insecurity:    Worry: Not on file    Inability: Not on file  . Transportation needs:    Medical: Not on file    Non-medical: Not on file  Tobacco Use  . Smoking status: Never Smoker  . Smokeless tobacco: Never Used  Substance and Sexual Activity  . Alcohol use: No  . Drug use: No  . Sexual activity: Yes  Lifestyle  . Physical activity:    Days per week: Not on file    Minutes per session: Not on file  . Stress: Not on file  Relationships  . Social connections:    Talks on phone: Not on file    Gets together: Not on file    Attends religious service: Not on file    Active member of club or organization: Not on file    Attends meetings of clubs or organizations: Not on file    Relationship status: Not on file  Other Topics Concern  . Not on file  Social History Narrative  . Not on file    Review of Systems: General: Negative for anorexia, weight loss, fever, chills, fatigue, weakness. ENT: Negative for hoarseness, difficulty swallowing. CV: Negative for chest pain, angina, palpitations, peripheral edema.  Respiratory: Negative for dyspnea at rest, cough, sputum, wheezing.  GI: See history of present illness. GU:  Notes some abnormal kidney labs from PCP (unsure of what they were, is scheduled for repeat labs).  MS: Negative for joint pain, low back pain.  Derm: Negative for rash or itching.  Endo: Negative for unusual weight change.  Heme: Negative for bruising or bleeding. Allergy: Negative for rash or hives.  Physical Exam: Note: limited exam due to virtual  visit General:   Alert and oriented. Pleasant and cooperative. Well-nourished and well-developed.  Head:  Normocephalic and atraumatic. Eyes:  Without icterus, sclera clear and conjunctiva pink.  Ears:  Normal auditory acuity. Skin:  Intact without facial significant lesions or rashes. Neurologic:  Alert and oriented x4;  grossly normal neurologically. Psych:  Alert and cooperative. Normal mood and affect. Heme/Lymph/Immune: No excessive bruising noted.

## 2019-04-17 NOTE — Assessment & Plan Note (Signed)
It seems the patient may have developed an element of reflux and dyspepsia.  Over-the-counter antacids have helped.  He has never been on a PPI before.  At this time I will start him on Prilosec 40 mg once daily to see how this does for him.  I have requested he call us with any significant worsening of his symptoms.  Return for follow-up in 3 months.  At that point we can discuss dose reduction and/or switching to an H2 receptor blocker versus continuance of PPI for symptomatic control.  He is due for colonoscopy in 1 year, and given his age and heritage may be a candidate for EGD for Barrett's esophagus at that time.  We can further discuss at future office visits.

## 2019-04-17 NOTE — Patient Instructions (Signed)
Your health issues we discussed today were:   Lower abdominal discomfort: 1. I feel you were likely constipated. 2. Continue taking Metamucil and probiotics. 3. If you need to, you can add an over-the-counter stool softener (Colace) once daily, or as needed for constipation 4. Call us if you have any significant worsening or severe symptoms  Bloating with "a feeling" into your chest 1. I am glad the emergency department cardiology determined this is not heart related 2. Because antacids have helped this, I feel you likely have GERD (reflux/heartburn) 3. I will start you on Prilosec 40 mg once daily.  Take this once a day, first thing in the morning 4. Call us if you have any severe symptoms or worsening symptoms  Overall I recommend:  1. Continue your other medications 2. Call us if you have any questions or concerns 3. Return for follow-up in 3 months.   Because of recent events of COVID-19 ("Coronavirus"), follow CDC recommendations:  1. Wash your hand frequently 2. Avoid touching your face 3. Stay away from people who are sick 4. If you have symptoms such as fever, cough, shortness of breath then call your healthcare provider for further guidance 5. If you are sick, STAY AT HOME unless otherwise directed by your healthcare provider. 6. Follow directions from state and national officials regarding staying safe   At [facility] we value your feedback. You may receive a survey about your visit today. Please share your experience as we strive to create trusting relationships with our patients to provide genuine, compassionate, quality care.  We appreciate your understanding and patience as we review any laboratory studies, imaging, and other diagnostic tests that are ordered as we care for you. Our office policy is 5 business days for review of these results, and any emergent or urgent results are addressed in a timely manner for your best interest. If you do not hear  from our office in 1 week, please contact us.   We also encourage the use of MyChart, which contains your medical information for your review as well. If you are not enrolled in this feature, an access code is on this after visit summary for your convenience. Thank you for allowing [place] to be involved in your care.  It was great to see you today!  I hope you have a great day!!

## 2019-04-17 NOTE — Assessment & Plan Note (Signed)
Patient likely developing an element of constipation.  He was having lower abdominal discomfort which is improved significantly since starting probiotics and Metamucil.  I recommended he continue these.  He can add Colace stool softener once daily or as needed, depending on the progression of his constipation.  Follow-up in 3 months, call us if any significant worsening.

## 2019-04-25 ENCOUNTER — Ambulatory Visit: Payer: BLUE CROSS/BLUE SHIELD | Admitting: Internal Medicine

## 2019-04-25 ENCOUNTER — Encounter

## 2019-05-01 ENCOUNTER — Telehealth: Payer: Self-pay | Admitting: *Deleted

## 2019-05-02 ENCOUNTER — Telehealth: Payer: Self-pay | Admitting: Internal Medicine

## 2019-05-02 NOTE — Telephone Encounter (Signed)
PATIENT WIFE CALLED AND LEFT A MESSAGE THAT THE PATIENT IS NOT DOING ANY BETTER SINCE HIS LAST VISIT.  PLEASE CALL

## 2019-05-02 NOTE — Telephone Encounter (Signed)
Spoke with pt. He is still having an aching pain on his sides, uncomfortable feeling in the throat and chest. Pt states that he doesn't feel any burning sensations. He is having daily bowel movements with no straining. Pt is taking Prilosec 40 mg once daily, metamucil and probiotics daily and feels he's not constipated or has problems with Travis Warner.

## 2019-05-02 NOTE — Telephone Encounter (Signed)
Lmom, waiting on a return call.  

## 2019-05-03 NOTE — Telephone Encounter (Signed)
See other phone note

## 2019-05-08 ENCOUNTER — Other Ambulatory Visit: Payer: Self-pay | Admitting: Family Medicine

## 2019-05-08 DIAGNOSIS — R1032 Left lower quadrant pain: Secondary | ICD-10-CM

## 2019-05-09 ENCOUNTER — Ambulatory Visit
Admission: RE | Admit: 2019-05-09 | Discharge: 2019-05-09 | Disposition: A | Discharge status: Home / Self Care | Payer: BLUE CROSS/BLUE SHIELD | Source: Ambulatory Visit | Attending: Family Medicine | Admitting: Family Medicine

## 2019-05-09 DIAGNOSIS — R1032 Left lower quadrant pain: Secondary | ICD-10-CM

## 2019-05-09 MED ORDER — IOPAMIDOL (ISOVUE-300) INJECTION 61%
100.0000 mL | Freq: Once | INTRAVENOUS | Status: AC | PRN
  Administered 2019-05-09: 100 mL via INTRAVENOUS

## 2019-07-26 ENCOUNTER — Ambulatory Visit: Payer: BLUE CROSS/BLUE SHIELD | Admitting: Nurse Practitioner

## 2019-07-31 ENCOUNTER — Ambulatory Visit: Payer: BLUE CROSS/BLUE SHIELD | Admitting: Cardiology

## 2019-08-23 ENCOUNTER — Other Ambulatory Visit: Payer: Self-pay | Admitting: Nurse Practitioner

## 2019-08-23 DIAGNOSIS — K219 Gastro-esophageal reflux disease without esophagitis: Secondary | ICD-10-CM

## 2019-08-23 DIAGNOSIS — R1084 Generalized abdominal pain: Secondary | ICD-10-CM

## 2019-08-23 DIAGNOSIS — K5900 Constipation, unspecified: Secondary | ICD-10-CM

## 2019-12-28 ENCOUNTER — Other Ambulatory Visit: Payer: Self-pay | Admitting: Nurse Practitioner

## 2019-12-28 DIAGNOSIS — K5900 Constipation, unspecified: Secondary | ICD-10-CM

## 2019-12-28 DIAGNOSIS — K219 Gastro-esophageal reflux disease without esophagitis: Secondary | ICD-10-CM

## 2019-12-28 DIAGNOSIS — R1084 Generalized abdominal pain: Secondary | ICD-10-CM

## 2020-01-31 ENCOUNTER — Telehealth: Payer: Self-pay | Admitting: Internal Medicine

## 2020-01-31 NOTE — Telephone Encounter (Signed)
Pt is on the April recall for a 3 year colonoscopy. Does he need a NV or OV prior to scheduling?

## 2020-01-31 NOTE — Telephone Encounter (Signed)
He needs an OV- due to meds and EG stated he may need an EGD with his next TCS.

## 2020-02-01 NOTE — Telephone Encounter (Signed)
Pt is aware of OV on 2/8 at 130

## 2020-02-04 NOTE — Progress Notes (Signed)
Referring Provider: Sharilyn Sites, MD Primary Care Physician:  Sharilyn Sites, MD Primary GI Physician: Dr. Gala Romney  Chief Complaint  Patient presents with  . Colonoscopy    due for tcs  . Gastroesophageal Reflux    HPI:   Travis Warner is a 59 y.o. male presenting today to schedule his colonoscopy as well as follow-up of constipation, left sided abdominal pain, and GERD.  History of colon polyps with last colonoscopy in April 2018.  2 semipedunculated polyps 4-7 mm in size were found in the ascending colon.  Pathology revealed sessile serrated polyp/adenoma with high-grade dysplasia.  Recommended repeat colonoscopy in 3 years (2021).  He was last seen by our staff via virtual visit on 04/17/2019 for constipation, abdominal pain, and GERD.  Patient was seen in the ER on 03/07/2019 for chest pain.  EKG normal.  Labs normal.  Cardiology suspected this was likely a pulled muscle.  Patient reported continuing to have a "feeling, but not pain" up into his chest that occurred at random or when laying down with associated bloating.  Heartburn symptoms with this which she had never had before.  OTC antacids help. Constipation was improved since starting Metamucil and probiotics.  Still had some mild left lower abdominal pain.  Was advised he could add Colace daily if needed.  Was suspected chest discomfort was likely secondary to GERD and was started on Prilosec 40 mg daily.  Advised to follow-up in 3 months.  Would need colonoscopy in 2021.  Considered possible Barrett's screening.   Today:  Will have pressure in his chest when laying down.  Sensation is at his lower sternum and occasionally comes up to his throat.  States it is not a pain.  Symptoms started last year just prior to seeing [place]. Omeprazole has helped. Taking every night before going to bed around 7:30pm.  Symptoms occur 2-3 nights a week which is about the same frequency, but intensity has improved quite a bit. No daytime symptoms. Has cut  back on food intake and lost about 30 lbs and abdominal bloating has resolved. Just decreased quantity of food intake and eliminated snacking. No fried or fatty foods. Drinks 2-3 soda a week. Doesn't lay down within 3 hours of eating dinner.  Last meal around 4 to 4:30 PM and lays down around 8:30 PM.  Chest discomfort is not associated with exercise, moving or turning a certain way, heart palpitations, or shortness of breath. States he never had heartburn in the past. Never had to take Tums or Rolaids prior to 1 year ago when the chest discomfort started. No epigastric discomfort. No dysphagia. No nausea. No vomiting. No other abdominal pain.   Constipation resolved. Not taking fiber supplement or probiotic at this time. BMs daily. No blood in the stool. No melena. No diarrhea. No longer having lower abdominal pain.   Past Medical History:  Diagnosis Date  . Cancer ([place])    skin cancer- basal cell on face  . Chronic kidney disease    kidney stone  . Hypercholesterolemia   . Insomnia     Past Surgical History:  Procedure Laterality Date  . COLONOSCOPY  02/24/2012   Procedure: COLONOSCOPY;  Surgeon: Daneil Dolin, MD;  Location: [facility];  Service: Endoscopy;  Laterality: N/A;  11:30 AM  . COLONOSCOPY N/A 04/22/2017   Procedure: COLONOSCOPY;  Surgeon: Daneil Dolin, MD;  2 sessile serrated polyp/adenomas with high grade dysplasia. Due for repeat in 2021.   Marland Kitchen POLYPECTOMY  04/22/2017   Procedure: POLYPECTOMY;  Surgeon: Daneil Dolin, MD;  Location: [facility];  Service: Endoscopy;;  colon  . Skin cancer removed from face      Current Outpatient Medications  Medication Sig Dispense Refill  . ALPRAZolam (XANAX) 1 MG tablet Take 1 tablet by mouth as needed.    Marland Kitchen omeprazole (PRILOSEC) 40 MG capsule Take 1 capsule (40 mg total) by mouth daily before breakfast. 30 capsule 11  . zolpidem (AMBIEN) 10 MG tablet Take 1 tablet by mouth at bedtime as needed.    . polyethylene  glycol-electrolytes (TRILYTE) 420 g solution Take 4,000 mLs by mouth as directed. 4000 mL 0   No current facility-administered medications for this visit.    Allergies as of 02/05/2020  . (No Known Allergies)    Family History  Problem Relation Age of Onset  . COPD Mother   . Liver cancer Father   . Colon cancer Neg Hx     Social History   Socioeconomic History  . Marital status: Married    Spouse name: Not on file  . Number of children: Not on file  . Years of education: Not on file  . Highest education level: Not on file  Occupational History  . Not on file  Tobacco Use  . Smoking status: Never Smoker  . Smokeless tobacco: Never Used  Substance and Sexual Activity  . Alcohol use: No  . Drug use: No  . Sexual activity: Yes  Other Topics Concern  . Not on file  Social History Narrative  . Not on file   Social Determinants of Health   Financial Resource Strain:   . Difficulty of Paying Living Expenses: Not on file  Food Insecurity:   . Worried About Charity fundraiser in the Last Year: Not on file  . Ran Out of Food in the Last Year: Not on file  Transportation Needs:   . Lack of Transportation (Medical): Not on file  . Lack of Transportation (Non-Medical): Not on file  Physical Activity:   . Days of Exercise per Week: Not on file  . Minutes of Exercise per Session: Not on file  Stress:   . Feeling of Stress : Not on file  Social Connections:   . Frequency of Communication with Friends and Family: Not on file  . Frequency of Social Gatherings with Friends and Family: Not on file  . Attends Religious Services: Not on file  . Active Member of Clubs or Organizations: Not on file  . Attends Archivist Meetings: Not on file  . Marital Status: Not on file    Review of Systems: Gen: Denies fever, chills, lightheadedness, dizziness, pre-syncope, or syncope.  CV: See HPI Resp: No shortness of breath or cough.  GI: See HPI. Derm: Denies rash Psych:  Admits to anxiety with travel.  Heme: See HPI  Physical Exam: BP 138/83   Pulse 74   Temp (!) 97.3 F (36.3 C) (Temporal)   Ht 5\' 9"  (1.753 m)   Wt 168 lb 9.6 oz (76.5 kg)   BMI 24.90 kg/m  General:   Alert and oriented. No distress noted. Pleasant and cooperative.  Head:  Normocephalic and atraumatic. Eyes:  Conjuctiva clear without scleral icterus. Heart:  S1, S2 present without murmurs appreciated. Lungs:  Clear to auscultation bilaterally. No wheezes, rales, or rhonchi. No distress.  Abdomen: +BS, soft, non-tender and non-distended. No rebound or guarding. No HSM or masses noted. Msk:  Symmetrical without gross  deformities. Normal posture. Extremities:  Without edema. Neurologic:  Alert and  oriented x4 Psych: Normal mood and affect.

## 2020-02-05 ENCOUNTER — Other Ambulatory Visit: Payer: Self-pay

## 2020-02-05 ENCOUNTER — Ambulatory Visit (INDEPENDENT_AMBULATORY_CARE_PROVIDER_SITE_OTHER): Payer: 59 | Admitting: Gastroenterology

## 2020-02-05 ENCOUNTER — Encounter: Payer: Self-pay | Admitting: Gastroenterology

## 2020-02-05 VITALS — BP 138/83 | HR 74 | Temp 97.3°F | Ht 69.0 in | Wt 168.6 lb

## 2020-02-05 DIAGNOSIS — K219 Gastro-esophageal reflux disease without esophagitis: Secondary | ICD-10-CM

## 2020-02-05 DIAGNOSIS — K5900 Constipation, unspecified: Secondary | ICD-10-CM

## 2020-02-05 DIAGNOSIS — Z86010 Personal history of colonic polyps: Secondary | ICD-10-CM

## 2020-02-05 MED ORDER — PEG 3350-KCL-NA BICARB-NACL 420 G PO SOLR: 4000.0000 mL | ORAL | 0 refills | Status: DC

## 2020-02-05 NOTE — Patient Instructions (Addendum)
We will get you scheduled for colonoscopy in the near future with Dr. Gala Romney.  For the discomfort that you are having in your chest, let us try adjusting the timing of your omeprazole as this works better when taken 30 minutes prior to meal.  Start taking omeprazole 30 minutes before dinner.  Be sure you are avoiding fried, fatty, greasy, spicy, citrus foods.  All meats should be lean including poultry or fish.  Avoid caffeine, carbonated beverages, chocolate.  Do not eat within 3 hours of laying down.  Try propping the head of your bed up on blood, bricks, or risers to create a 6 inch incline.  We will plan to follow-up with you in 4 months.  Please call in a few weeks with a progress report letting us know if medication adjustments and dietary/lifestyle adjustments have helped with the chest discomfort you experience at night.   Aliene Altes, PA-C [facility]   Food Choices for Gastroesophageal Reflux Disease, Adult When you have gastroesophageal reflux disease (GERD), the foods you eat and your eating habits are very important. Choosing the right foods can help ease the discomfort of GERD. Consider working with a diet and nutrition specialist (dietitian) to help you make healthy food choices. What general guidelines should I follow?  Eating plan  Choose healthy foods low in fat, such as fruits, vegetables, whole grains, low-fat dairy products, and lean meat, fish, and poultry.  Eat frequent, small meals instead of three large meals each day. Eat your meals slowly, in a relaxed setting. Avoid bending over or lying down until 2-3 hours after eating.  Limit high-fat foods such as fatty meats or fried foods.  Limit your intake of oils, butter, and shortening to less than 8 teaspoons each day.  Avoid the following: ? Foods that cause symptoms. These may be different for different people. Keep a food diary to keep track of foods that cause symptoms. ? Alcohol. ? Drinking  large amounts of liquid with meals. ? Eating meals during the 2-3 hours before bed.  Cook foods using methods other than frying. This may include baking, grilling, or broiling. Lifestyle  Maintain a healthy weight. Ask your health care provider what weight is healthy for you. If you need to lose weight, work with your health care provider to do so safely.  Exercise for at least 30 minutes on 5 or more days each week, or as told by your health care provider.  Avoid wearing clothes that fit tightly around your waist and chest.  Do not use any products that contain nicotine or tobacco, such as cigarettes and e-cigarettes. If you need help quitting, ask your health care provider.  Sleep with the head of your bed raised. Use a wedge under the mattress or blocks under the bed frame to raise the head of the bed. What foods are not recommended? The items listed may not be a complete list. Talk with your dietitian about what dietary choices are best for you. Grains Pastries or quick breads with added fat. [place] toast. Vegetables Deep fried vegetables. [place] fries. Any vegetables prepared with added fat. Any vegetables that cause symptoms. For some people this may include tomatoes and tomato products, chili peppers, onions and garlic, and horseradish. Fruits Any fruits prepared with added fat. Any fruits that cause symptoms. For some people this may include citrus fruits, such as oranges, grapefruit, pineapple, and lemons. Meats and other protein foods High-fat meats, such as fatty beef or pork, hot dogs, ribs, ham,  sausage, salami and bacon. Fried meat or protein, including fried fish and fried chicken. Nuts and nut butters. Dairy Whole milk and chocolate milk. Sour cream. Cream. Ice cream. Cream cheese. Milk shakes. Beverages Coffee and tea, with or without caffeine. Carbonated beverages. Sodas. Energy drinks. Fruit juice made with acidic fruits (such as orange or grapefruit). Tomato juice.  Alcoholic drinks. Fats and oils Butter. Margarine. Shortening. Ghee. Sweets and desserts Chocolate and cocoa. Donuts. Seasoning and other foods Pepper. Peppermint and spearmint. Any condiments, herbs, or seasonings that cause symptoms. For some people, this may include curry, hot sauce, or vinegar-based salad dressings. Summary  When you have gastroesophageal reflux disease (GERD), food and lifestyle choices are very important to help ease the discomfort of GERD.  Eat frequent, small meals instead of three large meals each day. Eat your meals slowly, in a relaxed setting. Avoid bending over or lying down until 2-3 hours after eating.  Limit high-fat foods such as fatty meat or fried foods. This information is not intended to replace advice given to you by your health care provider. Make sure you discuss any questions you have with your health care provider. Document Revised: 04/06/2019 Document Reviewed: 12/15/2016 Elsevier Patient Education  [place].

## 2020-02-06 ENCOUNTER — Encounter: Payer: Self-pay | Admitting: Gastroenterology

## 2020-02-06 NOTE — Assessment & Plan Note (Addendum)
Patient continues with sensation of pressure in his lower sternum that occasionally comes up into his throat when laying down at night.  No daytime symptoms.  No association with exercise, heart palpitations, or shortness of breath.  Denies any history of heartburn.  ED evaluation in March 2020 with Trop neg x 1. CXR no acute process. EKG NSR, no ischemic changes. Cardiology follow-up thereafter felt to be a pulled muscle. At his virtual visit in April 2020, he was started on omeprazole 40 mg daily which has helped significantly with severity.  He does continue to have symptoms 2-3 nights a week which may be related to patient not taking PPI optimally.  Denies typical heartburn/reflux symptoms, epigastric pain, dysphagia, nausea or vomiting, melena, or bright red blood per rectum.  Patient reports intentional weight loss through changing portion sizes and eliminating snacking which has resolved abdominal bloating.    As patients symptoms have improved since starting omeprazole, I do feel chest discomfort is likely related to GERD.  Possibly influenced by dietary habits.  He was advised to start taking omeprazole 30 minutes before dinner rather than right before laying down. Advised to avoid fried, fatty, greasy, spicy, citrus foods.  All meats should be lean including poultry or fish.  Avoid caffeine, carbonated beverages, chocolate.  Do not eat within 3 hours of laying down.  Try propping head of bed up to create 6 inch incline. Plan to follow-up in 4 months.  Patient was advised to call in a few weeks with a progress report.   Of note, possible Barrett's esophagus screening was mentioned at last visit. As patient is without history of GERD prior to 1 year ago, he doesn't necessarily meet criteria for screening. However, if he continues with frequent symptoms despite above recommendations, may need to consider EGD.

## 2020-02-06 NOTE — Progress Notes (Signed)
Cc'ed to pcp °

## 2020-02-06 NOTE — Assessment & Plan Note (Addendum)
59 year old male with history of colon polyps currently due for repeat colonoscopy.  Last colonoscopy in April 2018 with two 4-7 mm polyps in the ascending colon.  Pathology revealed sessile serrated polyp/adenoma with high-grade dysplasia.  He is currently without any lower GI symptoms or alarm symptoms.  No family history of colon cancer.  Proceed with TCS with propofol with Dr. Buford Dresser in the near future. The risks, benefits, and alternatives have been discussed in detail with patient. They have stated understanding and desire to proceed.  Propofol due to medications including Xanax and Ambien. Follow-up in 4 months for suspected GERD symptoms as discussed above.

## 2020-02-06 NOTE — Assessment & Plan Note (Addendum)
Constipation and mild left-sided lower abdominal pain has resolved.  He was taking Metamucil and probiotics for this initially but he has discontinued these and has not had any return of symptoms.  Currently with BMs daily.  No alarm symptoms.  He is due for repeat colonoscopy this year.  Last colonoscopy in April 2018 with two 4-7 mm sized polyps in the ascending colon.  Pathology revealed sessile serrated polyp/adenoma with high-grade dysplasia.  No family history of colon cancer.  Continue to monitor symptoms. Proceed with TCS with propofol with Dr. Buford Dresser in the near future. The risks, benefits, and alternatives have been discussed in detail with patient. They have stated understanding and desire to proceed.  Propofol due to medications including Xanax and Ambien. Follow-up in 4 months for suspected GERD symptoms as discussed above.

## 2020-02-26 ENCOUNTER — Telehealth: Payer: Self-pay | Admitting: *Deleted

## 2020-02-26 NOTE — Telephone Encounter (Signed)
PA submitted for TCS via [facility]. Clinicals faxed to [phone]

## 2020-02-28 NOTE — Telephone Encounter (Signed)
PA approved. Case # [id number] Dates 04/01/2020-05/28/2020

## 2020-03-28 ENCOUNTER — Encounter (HOSPITAL_COMMUNITY): Payer: Self-pay

## 2020-03-28 ENCOUNTER — Telehealth: Payer: Self-pay

## 2020-03-28 ENCOUNTER — Encounter (HOSPITAL_COMMUNITY)
Admission: RE | Admit: 2020-03-28 | Discharge: 2020-03-28 | Disposition: A | Discharge status: Home / Self Care | Payer: 59 | Source: Ambulatory Visit | Attending: Internal Medicine | Admitting: Internal Medicine

## 2020-03-28 ENCOUNTER — Other Ambulatory Visit: Payer: Self-pay

## 2020-03-28 ENCOUNTER — Other Ambulatory Visit (HOSPITAL_COMMUNITY)
Admission: RE | Admit: 2020-03-28 | Discharge: 2020-03-28 | Disposition: A | Discharge status: Home / Self Care | Payer: 59 | Source: Ambulatory Visit | Attending: Internal Medicine | Admitting: Internal Medicine

## 2020-03-28 DIAGNOSIS — Z01812 Encounter for preprocedural laboratory examination: Secondary | ICD-10-CM | POA: Insufficient documentation

## 2020-03-28 DIAGNOSIS — Z20822 Contact with and (suspected) exposure to covid-19: Secondary | ICD-10-CM | POA: Diagnosis not present

## 2020-03-28 HISTORY — DX: Essential (primary) hypertension: I10

## 2020-03-28 NOTE — Telephone Encounter (Signed)
Called pt, TCS for 04/01/20 moved up to 10:00am. Advised him to arrive at 8:30am. Start drinking 2nd half of prep morning of procedure at 5:00am. NPO after 7:00am. Endo scheduler informed.

## 2020-03-29 LAB — SARS CORONAVIRUS 2 (TAT 6-24 HRS): SARS Coronavirus 2: NEGATIVE

## 2020-04-01 ENCOUNTER — Ambulatory Visit (HOSPITAL_COMMUNITY): Payer: 59 | Admitting: Anesthesiology

## 2020-04-01 ENCOUNTER — Encounter (HOSPITAL_COMMUNITY)
Admission: RE | Disposition: A | Discharge status: Home / Self Care | Payer: Self-pay | Source: Ambulatory Visit | Attending: Internal Medicine

## 2020-04-01 ENCOUNTER — Encounter (HOSPITAL_COMMUNITY): Payer: Self-pay | Admitting: Internal Medicine

## 2020-04-01 ENCOUNTER — Ambulatory Visit (HOSPITAL_COMMUNITY)
Admission: RE | Admit: 2020-04-01 | Discharge: 2020-04-01 | Disposition: A | Discharge status: Home / Self Care | Payer: 59 | Source: Ambulatory Visit | Attending: Internal Medicine | Admitting: Internal Medicine

## 2020-04-01 DIAGNOSIS — Z86010 Personal history of colonic polyps: Secondary | ICD-10-CM | POA: Insufficient documentation

## 2020-04-01 DIAGNOSIS — Z85828 Personal history of other malignant neoplasm of skin: Secondary | ICD-10-CM | POA: Diagnosis not present

## 2020-04-01 DIAGNOSIS — E7800 Pure hypercholesterolemia, unspecified: Secondary | ICD-10-CM | POA: Diagnosis not present

## 2020-04-01 DIAGNOSIS — K640 First degree hemorrhoids: Secondary | ICD-10-CM | POA: Insufficient documentation

## 2020-04-01 DIAGNOSIS — Z87442 Personal history of urinary calculi: Secondary | ICD-10-CM | POA: Diagnosis not present

## 2020-04-01 DIAGNOSIS — Z800 Family history of malignant neoplasm of digestive organs: Secondary | ICD-10-CM | POA: Insufficient documentation

## 2020-04-01 DIAGNOSIS — G4700 Insomnia, unspecified: Secondary | ICD-10-CM | POA: Insufficient documentation

## 2020-04-01 DIAGNOSIS — Z1211 Encounter for screening for malignant neoplasm of colon: Secondary | ICD-10-CM | POA: Diagnosis present

## 2020-04-01 DIAGNOSIS — D123 Benign neoplasm of transverse colon: Secondary | ICD-10-CM | POA: Diagnosis not present

## 2020-04-01 DIAGNOSIS — N189 Chronic kidney disease, unspecified: Secondary | ICD-10-CM | POA: Diagnosis not present

## 2020-04-01 DIAGNOSIS — K635 Polyp of colon: Secondary | ICD-10-CM | POA: Diagnosis not present

## 2020-04-01 DIAGNOSIS — Z79899 Other long term (current) drug therapy: Secondary | ICD-10-CM | POA: Insufficient documentation

## 2020-04-01 DIAGNOSIS — K219 Gastro-esophageal reflux disease without esophagitis: Secondary | ICD-10-CM | POA: Insufficient documentation

## 2020-04-01 DIAGNOSIS — Z825 Family history of asthma and other chronic lower respiratory diseases: Secondary | ICD-10-CM | POA: Diagnosis not present

## 2020-04-01 DIAGNOSIS — I129 Hypertensive chronic kidney disease with stage 1 through stage 4 chronic kidney disease, or unspecified chronic kidney disease: Secondary | ICD-10-CM | POA: Diagnosis not present

## 2020-04-01 HISTORY — PX: COLONOSCOPY WITH PROPOFOL: SHX5780

## 2020-04-01 HISTORY — PX: POLYPECTOMY: SHX5525

## 2020-04-01 LAB — GLUCOSE, CAPILLARY: Glucose-Capillary: 98 mg/dL (ref 70–99)

## 2020-04-01 SURGERY — COLONOSCOPY WITH PROPOFOL
Anesthesia: General

## 2020-04-01 MED ORDER — PROPOFOL 10 MG/ML IV BOLUS
INTRAVENOUS | Status: DC | PRN
  Administered 2020-04-01 (×2): 50 ug via INTRAVENOUS

## 2020-04-01 MED ORDER — CHLORHEXIDINE GLUCONATE CLOTH 2 % EX PADS: 6.0000 | MEDICATED_PAD | Freq: Once | CUTANEOUS | Status: DC

## 2020-04-01 MED ORDER — LACTATED RINGERS IV SOLN: Freq: Once | INTRAVENOUS | Status: AC

## 2020-04-01 MED ORDER — PROPOFOL 500 MG/50ML IV EMUL
INTRAVENOUS | Status: DC | PRN
  Administered 2020-04-01: 200 ug/kg/min via INTRAVENOUS

## 2020-04-01 MED ORDER — FENTANYL CITRATE (PF) 100 MCG/2ML IJ SOLN
INTRAMUSCULAR | Status: DC | PRN
  Administered 2020-04-01: 50 ug via INTRAVENOUS

## 2020-04-01 MED ORDER — LIDOCAINE 2% (20 MG/ML) 5 ML SYRINGE
INTRAMUSCULAR | Status: AC
  Filled 2020-04-01: qty 5

## 2020-04-01 MED ORDER — LACTATED RINGERS IV SOLN: INTRAVENOUS | Status: DC | PRN

## 2020-04-01 MED ORDER — PROPOFOL 10 MG/ML IV BOLUS
INTRAVENOUS | Status: AC
  Filled 2020-04-01: qty 40

## 2020-04-01 MED ORDER — LIDOCAINE HCL (CARDIAC) PF 100 MG/5ML IV SOSY
PREFILLED_SYRINGE | INTRAVENOUS | Status: DC | PRN
  Administered 2020-04-01: 40 mg via INTRATRACHEAL

## 2020-04-01 MED ORDER — FENTANYL CITRATE (PF) 100 MCG/2ML IJ SOLN
INTRAMUSCULAR | Status: AC
  Filled 2020-04-01: qty 2

## 2020-04-01 NOTE — Anesthesia Preprocedure Evaluation (Addendum)
Anesthesia Evaluation  Patient identified by MRN, date of birth, ID band Patient awake    Reviewed: Allergy & Precautions, NPO status , Patient's Chart, lab work & pertinent test results  History of Anesthesia Complications Negative for: history of anesthetic complications  Airway Mallampati: III  TM Distance: >3 FB Neck ROM: Full    Dental  (+) Dental Advisory Given, Caps   Pulmonary neg pulmonary ROS,    Pulmonary exam normal breath sounds clear to auscultation       Cardiovascular Exercise Tolerance: Good hypertension, Pt. on medications  Rhythm:Regular Rate:Normal     Neuro/Psych negative neurological ROS  negative psych ROS   GI/Hepatic GERD  Medicated and Controlled,  Endo/Other    Renal/GU Renal InsufficiencyRenal disease     Musculoskeletal negative musculoskeletal ROS (+)   Abdominal   Peds  Hematology   Anesthesia Other Findings   Reproductive/Obstetrics negative OB ROS                            Anesthesia Physical Anesthesia Plan  ASA: II  Anesthesia Plan: General   Post-op Pain Management:    Induction: Intravenous  PONV Risk Score and Plan: 2 and Treatment may vary due to age or medical condition  Airway Management Planned: Nasal Cannula, Natural Airway and Simple Face Mask  Additional Equipment:   Intra-op Plan:   Post-operative Plan:   Informed Consent: I have reviewed the patients History and Physical, chart, labs and discussed the procedure including the risks, benefits and alternatives for the proposed anesthesia with the patient or authorized representative who has indicated his/her understanding and acceptance.     Dental advisory given  Plan Discussed with: CRNA and Surgeon  Anesthesia Plan Comments:        Anesthesia Quick Evaluation

## 2020-04-01 NOTE — Discharge Instructions (Signed)
Colonoscopy Discharge Instructions  Read the instructions outlined below and refer to this sheet in the next few weeks. These discharge instructions provide you with general information on caring for yourself after you leave the hospital. Your doctor may also give you specific instructions. While your treatment has been planned according to the most current medical practices available, unavoidable complications occasionally occur. If you have any problems or questions after discharge, call Dr. Gala Romney at [phone]. ACTIVITY  You may resume your regular activity, but move at a slower pace for the next 24 hours.   Take frequent rest periods for the next 24 hours.   Walking will help get rid of the air and reduce the bloated feeling in your belly (abdomen).   No driving for 24 hours (because of the medicine (anesthesia) used during the test).    Do not sign any important legal documents or operate any machinery for 24 hours (because of the anesthesia used during the test).  NUTRITION  Drink plenty of fluids.   You may resume your normal diet as instructed by your doctor.   Begin with a light meal and progress to your normal diet. Heavy or fried foods are harder to digest and may make you feel sick to your stomach (nauseated).   Avoid alcoholic beverages for 24 hours or as instructed.  MEDICATIONS  You may resume your normal medications unless your doctor tells you otherwise.  WHAT YOU CAN EXPECT TODAY  Some feelings of bloating in the abdomen.   Passage of more gas than usual.   Spotting of blood in your stool or on the toilet paper.  IF YOU HAD POLYPS REMOVED DURING THE COLONOSCOPY:  No aspirin products for 7 days or as instructed.   No alcohol for 7 days or as instructed.   Eat a soft diet for the next 24 hours.  FINDING OUT THE RESULTS OF YOUR TEST Not all test results are available during your visit. If your test results are not back during the visit, make an appointment  with your caregiver to find out the results. Do not assume everything is normal if you have not heard from your caregiver or the medical facility. It is important for you to follow up on all of your test results.  SEEK IMMEDIATE MEDICAL ATTENTION IF:  You have more than a spotting of blood in your stool.   Your belly is swollen (abdominal distention).   You are nauseated or vomiting.   You have a temperature over 101.   You have abdominal pain or discomfort that is severe or gets worse throughout the day.   Colon polyp information provided  Further recommendations to follow pending review of pathology report  At patient request, I called Manuela Schwartz at [phone] -rolled to voicemail      Colon Polyps  Polyps are tissue growths inside the body. Polyps can grow in many places, including the large intestine (colon). A polyp may be a round bump or a mushroom-shaped growth. You could have one polyp or several. Most colon polyps are noncancerous (benign). However, some colon polyps can become cancerous over time. Finding and removing the polyps early can help prevent this. What are the causes? The exact cause of colon polyps is not known. What increases the risk? You are more likely to develop this condition if you:  Have a family history of colon cancer or colon polyps.  Are older than 57 or older than 45 if you are African American.  Have inflammatory bowel  disease, such as ulcerative colitis or Crohn's disease.  Have certain hereditary conditions, such as: ? Familial adenomatous polyposis. ? Lynch syndrome. ? Turcot syndrome. ? Peutz-Jeghers syndrome.  Are overweight.  Smoke cigarettes.  Do not get enough exercise.  Drink too much alcohol.  Eat a diet that is high in fat and red meat and low in fiber.  Had childhood cancer that was treated with abdominal radiation. What are the signs or symptoms? Most polyps do not cause symptoms. If you have symptoms, they may  include:  Blood coming from your rectum when having a bowel movement.  Blood in your stool. The stool may look dark red or black.  Abdominal pain.  A change in bowel habits, such as constipation or diarrhea. How is this diagnosed? This condition is diagnosed with a colonoscopy. This is a procedure in which a lighted, flexible scope is inserted into the anus and then passed into the colon to examine the area. Polyps are sometimes found when a colonoscopy is done as part of routine cancer screening tests. How is this treated? Treatment for this condition involves removing any polyps that are found. Most polyps can be removed during a colonoscopy. Those polyps will then be tested for cancer. Additional treatment may be needed depending on the results of testing. Follow these instructions at home: Lifestyle  Maintain a healthy weight, or lose weight if recommended by your health care provider.  Exercise every day or as told by your health care provider.  Do not use any products that contain nicotine or tobacco, such as cigarettes and e-cigarettes. If you need help quitting, ask your health care provider.  If you drink alcohol, limit how much you have: ? 0-1 drink a day for women. ? 0-2 drinks a day for men.  Be aware of how much alcohol is in your drink. In the [place] one drink equals one 12 oz bottle of beer (355 mL), one 5 oz glass of wine (148 mL), or one 1 oz shot of hard liquor (44 mL). Eating and drinking   Eat foods that are high in fiber, such as fruits, vegetables, and whole grains.  Eat foods that are high in calcium and vitamin D, such as milk, cheese, yogurt, eggs, liver, fish, and broccoli.  Limit foods that are high in fat, such as fried foods and desserts.  Limit the amount of red meat and processed meat you eat, such as hot dogs, sausage, bacon, and lunch meats. General instructions  Keep all follow-up visits as told by your health care provider. This is  important. ? This includes having regularly scheduled colonoscopies. ? Talk to your health care provider about when you need a colonoscopy. Contact a health care provider if:  You have new or worsening bleeding during a bowel movement.  You have new or increased blood in your stool.  You have a change in bowel habits.  You lose weight for no known reason. Summary  Polyps are tissue growths inside the body. Polyps can grow in many places, including the colon.  Most colon polyps are noncancerous (benign), but some can become cancerous over time.  This condition is diagnosed with a colonoscopy.  Treatment for this condition involves removing any polyps that are found. Most polyps can be removed during a colonoscopy. This information is not intended to replace advice given to you by your health care provider. Make sure you discuss any questions you have with your health care provider. Document Revised: 03/31/2018 Document Reviewed: 03/31/2018  Elsevier Patient Education  2020 [place] After These instructions provide you with information about caring for yourself after your procedure. Your health care provider may also give you more specific instructions. Your treatment has been planned according to current medical practices, but problems sometimes occur. Call your health care provider if you have any problems or questions after your procedure. What can I expect after the procedure? After your procedure, you may:  Feel sleepy for several hours.  Feel clumsy and have poor balance for several hours.  Feel forgetful about what happened after the procedure.  Have poor judgment for several hours.  Feel nauseous or vomit.  Have a sore throat if you had a breathing tube during the procedure. Follow these instructions at home: For at least 24 hours after the procedure:      Have a responsible adult stay with you. It is important to have  someone help care for you until you are awake and alert.  Rest as needed.  Do not: ? Participate in activities in which you could fall or become injured. ? Drive. ? Use heavy machinery. ? Drink alcohol. ? Take sleeping pills or medicines that cause drowsiness. ? Make important decisions or sign legal documents. ? Take care of children on your own. Eating and drinking  Follow the diet that is recommended by your health care provider.  If you vomit, drink water, juice, or soup when you can drink without vomiting.  Make sure you have little or no nausea before eating solid foods. General instructions  Take over-the-counter and prescription medicines only as told by your health care provider.  If you have sleep apnea, surgery and certain medicines can increase your risk for breathing problems. Follow instructions from your health care provider about wearing your sleep device: ? Anytime you are sleeping, including during daytime naps. ? While taking prescription pain medicines, sleeping medicines, or medicines that make you drowsy.  If you smoke, do not smoke without supervision.  Keep all follow-up visits as told by your health care provider. This is important. Contact a health care provider if:  You keep feeling nauseous or you keep vomiting.  You feel light-headed.  You develop a rash.  You have a fever. Get help right away if:  You have trouble breathing. Summary  For several hours after your procedure, you may feel sleepy and have poor judgment.  Have a responsible adult stay with you for at least 24 hours or until you are awake and alert. This information is not intended to replace advice given to you by your health care provider. Make sure you discuss any questions you have with your health care provider. Document Revised: 03/14/2018 Document Reviewed: 04/05/2016 Elsevier Patient Education  [place].

## 2020-04-01 NOTE — H&P (Signed)
@LOGO @   Primary Care Physician:  Sharilyn Sites, MD Primary Gastroenterologist:  Dr. Gala Romney  Pre-Procedure History & Physical: HPI:  Travis Warner is a 59 y.o. male here for surveillance colonoscopy.  History of a serrated adenomas removed 3 years ago.  High-grade dysplasia noted.  Patient without any bowel symptoms.  Here for surveillance examination.  Past Medical History:  Diagnosis Date  . Cancer ([place])    skin cancer- basal cell on face  . Chronic kidney disease    kidney stone  . Hypercholesterolemia   . Hypertension   . Insomnia     Past Surgical History:  Procedure Laterality Date  . COLONOSCOPY  02/24/2012   Procedure: COLONOSCOPY;  Surgeon: Daneil Dolin, MD;  Location: [facility];  Service: Endoscopy;  Laterality: N/A;  11:30 AM  . COLONOSCOPY N/A 04/22/2017   Procedure: COLONOSCOPY;  Surgeon: Daneil Dolin, MD;  2 sessile serrated polyp/adenomas with high grade dysplasia. Due for repeat in 2021.   Marland Kitchen POLYPECTOMY  04/22/2017   Procedure: POLYPECTOMY;  Surgeon: Daneil Dolin, MD;  Location: [facility];  Service: Endoscopy;;  colon  . Skin cancer removed from face      Prior to Admission medications   Medication Sig Start Date End Date Taking? Authorizing Provider  ALPRAZolam Duanne Moron) 1 MG tablet Take 1 mg by mouth daily as needed for anxiety.  02/17/19  Yes [provider]  Multiple Vitamin (MULTIVITAMIN WITH MINERALS) TABS tablet Take 2 tablets by mouth daily.   Yes [provider]  olmesartan (BENICAR) 20 MG tablet Take 20 mg by mouth daily. 03/08/20  Yes [provider]  polyethylene glycol-electrolytes (TRILYTE) 420 g solution Take 4,000 mLs by mouth as directed. 02/05/20  Yes Kimiyah Blick, Cristopher Estimable, MD  rosuvastatin (CRESTOR) 20 MG tablet Take 20 mg by mouth at bedtime. 03/01/20  Yes [provider]  zolpidem (AMBIEN) 10 MG tablet Take 10 mg by mouth at bedtime as needed for sleep.  02/17/19  Yes [provider]  omeprazole  (PRILOSEC) 40 MG capsule Take 1 capsule (40 mg total) by mouth daily before breakfast. Patient not taking: Reported on 03/21/2020 12/28/19   Mahala Menghini, PA-C    Allergies as of 02/05/2020  . (No Known Allergies)    Family History  Problem Relation Age of Onset  . COPD Mother   . Liver cancer Father   . Colon cancer Neg Hx     Social History   Socioeconomic History  . Marital status: Married    Spouse name: Not on file  . Number of children: Not on file  . Years of education: Not on file  . Highest education level: Not on file  Occupational History  . Not on file  Tobacco Use  . Smoking status: Never Smoker  . Smokeless tobacco: Never Used  Substance and Sexual Activity  . Alcohol use: No  . Drug use: No  . Sexual activity: Yes  Other Topics Concern  . Not on file  Social History Narrative  . Not on file   Social Determinants of Health   Financial Resource Strain:   . Difficulty of Paying Living Expenses:   Food Insecurity:   . Worried About Charity fundraiser in the Last Year:   . Arboriculturist in the Last Year:   Transportation Needs:   . Film/video editor (Medical):   Marland Kitchen Lack of Transportation (Non-Medical):   Physical Activity:   . Days of  Exercise per Week:   . Minutes of Exercise per Session:   Stress:   . Feeling of Stress :   Social Connections:   . Frequency of Communication with Friends and Family:   . Frequency of Social Gatherings with Friends and Family:   . Attends Religious Services:   . Active Member of Clubs or Organizations:   . Attends Archivist Meetings:   Marland Kitchen Marital Status:   Intimate Partner Violence:   . Fear of Current or Ex-Partner:   . Emotionally Abused:   Marland Kitchen Physically Abused:   . Sexually Abused:     Review of Systems: See HPI, otherwise negative ROS  Physical Exam: BP (!) 152/80   Pulse 79   Temp 97.6 F (36.4 C) (Oral)   Resp (!) 22   Ht 5\' 9"  (1.753 m)   Wt 72.6 kg   SpO2 99%   BMI 23.63  kg/m  General:   Alert,  Well-developed, well-nourished, pleasant and cooperative in NAD Neck:  Supple; no masses or thyromegaly. No significant cervical adenopathy. Lungs:  Clear throughout to auscultation.   No wheezes, crackles, or rhonchi. No acute distress. Heart:  Regular rate and rhythm; no murmurs, clicks, rubs,  or gallops. Abdomen: Non-distended, normal bowel sounds.  Soft and nontender without appreciable mass or hepatosplenomegaly.  Pulses:  Normal pulses noted. Extremities:  Without clubbing or edema.  Impression/Plan: 59 year old gentleman with a history of colonic polyps.  Here for surveillance colonoscopy per plan.  The risks, benefits, limitations, alternatives and imponderables have been reviewed with the patient. Questions have been answered. All parties are agreeable.      Notice: This dictation was prepared with Dragon dictation along with smaller phrase technology. Any transcriptional errors that result from this process are unintentional and may not be corrected upon review.

## 2020-04-01 NOTE — Op Note (Signed)
[facility] Patient Name: Travis Warner Procedure Date: 04/01/2020 10:16 AM MRN: [id number] Date of Birth: 06-06-1961 Attending MD: Norvel Richards , MD CSN: [id number] Age: 59 Admit Type: Outpatient Procedure:                Colonoscopy Indications:              High risk colon cancer surveillance: Personal                            history of colonic polyps Providers:                Norvel Richards, MD, Otis Peak B. Sharon Seller, RN,                            Raphael Gibney, Technician Referring MD:              Medicines:                Propofol per Anesthesia Complications:            No immediate complications. Estimated Blood Loss:     Estimated blood loss was minimal. Procedure:                Pre-Anesthesia Assessment:                           - Prior to the procedure, a History and Physical                            was performed, and patient medications and                            allergies were reviewed. The patient's tolerance of                            previous anesthesia was also reviewed. The risks                            and benefits of the procedure and the sedation                            options and risks were discussed with the patient.                            All questions were answered, and informed consent                            was obtained. Prior Anticoagulants: The patient has                            taken no previous anticoagulant or antiplatelet                            agents. ASA Grade Assessment: II - A patient with  mild systemic disease. After reviewing the risks                            and benefits, the patient was deemed in                            satisfactory condition to undergo the procedure.                           After obtaining informed consent, the colonoscope                            was passed under direct vision. Throughout the                            procedure, the  patient's blood pressure, pulse, and                            oxygen saturations were monitored continuously. The                            [id number] scope was introduced through                            the anus and advanced to the the cecum, identified                            by appendiceal orifice and ileocecal valve. The                            colonoscopy was performed without difficulty. The                            patient tolerated the procedure well. The quality                            of the bowel preparation was adequate. The                            ileocecal valve, appendiceal orifice, and rectum                            were photographed. Scope In: 10:29:05 AM Scope Out: 10:44:14 AM Scope Withdrawal Time: 0 hours 11 minutes 51 seconds  Total Procedure Duration: 0 hours 15 minutes 9 seconds  Findings:      The perianal and digital rectal examinations were normal.      A 4 mm polyp was found in the hepatic flexure. The polyp was sessile.       The polyp was removed with a cold snare. Resection and retrieval were       complete. Estimated blood loss was minimal.      Non-bleeding internal hemorrhoids were found during retroflexion. The       hemorrhoids were mild, small and Grade I (internal hemorrhoids that do  not prolapse).      The exam was otherwise without abnormality on direct and retroflexion       views. Impression:               - One 4 mm polyp at the hepatic flexure, removed                            with a cold snare. Resected and retrieved.                           - Non-bleeding internal hemorrhoids.                           - The examination was otherwise normal on direct                            and retroflexion views. Moderate Sedation:      Moderate (conscious) sedation was personally administered by an       anesthesia professional. The following parameters were monitored: oxygen       saturation, heart rate, blood  pressure, respiratory rate, EKG, adequacy       of pulmonary ventilation, and response to care. Recommendation:           - Patient has a contact number available for                            emergencies. The signs and symptoms of potential                            delayed complications were discussed with the                            patient. Return to normal activities tomorrow.                            Written discharge instructions were provided to the                            patient.                           - Advance diet as tolerated.                           - Continue present medications.                           - Repeat colonoscopy date to be determined after                            pending pathology results are reviewed for                            surveillance.                           - Return  to GI office (date not yet determined). Procedure Code(s):        --- Professional ---                           [phone], Colonoscopy, flexible; with removal of                            tumor(s), polyp(s), or other lesion(s) by snare                            technique Diagnosis Code(s):        --- Professional ---                           Z86.010, Personal history of colonic polyps                           K63.5, Polyp of colon                           K64.0, First degree hemorrhoids CPT copyright 2019 American Medical Association. All rights reserved. The codes documented in this report are preliminary and upon coder review may  be revised to meet current compliance requirements. Cristopher Estimable. Yuriko Portales, MD Norvel Richards, MD 04/01/2020 10:47:47 AM This report has been signed electronically. Number of Addenda: 0

## 2020-04-01 NOTE — Addendum Note (Signed)
Addendum  created 04/01/20 1222 by Bertil Brickey A, CRNA   Intraprocedure Staff edited    

## 2020-04-01 NOTE — Transfer of Care (Signed)
Immediate Anesthesia Transfer of Care Note  Patient: Travis Warner  Procedure(s) Performed: COLONOSCOPY WITH PROPOFOL (N/A ) POLYPECTOMY  Patient Location: PACU  Anesthesia Type:General  Level of Consciousness: awake, alert  and oriented  Airway & Oxygen Therapy: Patient Spontanous Breathing  Post-op Assessment: Report given to RN, Post -op Vital signs reviewed and stable and Patient moving all extremities X 4  Post vital signs: Reviewed and stable  Last Vitals:  Vitals Value Taken Time  BP 101/60 04/01/20 1051  Temp 36.8 C 04/01/20 1051  Pulse 68 04/01/20 1059  Resp 21 04/01/20 1059  SpO2 100 % 04/01/20 1059  Vitals shown include unvalidated device data.  Last Pain:  Vitals:   04/01/20 1051  TempSrc:   PainSc: 0-No pain         Complications: No apparent anesthesia complications

## 2020-04-01 NOTE — Anesthesia Postprocedure Evaluation (Signed)
Anesthesia Post Note  Patient: Travis Warner  Procedure(s) Performed: COLONOSCOPY WITH PROPOFOL (N/A ) POLYPECTOMY  Patient location during evaluation: Endoscopy Anesthesia Type: General Level of consciousness: awake and alert Pain management: pain level controlled Vital Signs Assessment: post-procedure vital signs reviewed and stable Respiratory status: spontaneous breathing, nonlabored ventilation, respiratory function stable and patient connected to nasal cannula oxygen Cardiovascular status: blood pressure returned to baseline and stable Postop Assessment: no apparent nausea or vomiting Anesthetic complications: no     Last Vitals:  Vitals:   04/01/20 1130 04/01/20 1139  BP:  139/69  Pulse: 67   Resp: 15 18  Temp:  (!) 36.4 C  SpO2:  97%    Last Pain:  Vitals:   04/01/20 1139  TempSrc: Oral  PainSc: 0-No pain                 Talitha Givens

## 2020-04-02 LAB — SURGICAL PATHOLOGY

## 2020-04-06 ENCOUNTER — Encounter: Payer: Self-pay | Admitting: Internal Medicine

## 2020-06-06 ENCOUNTER — Ambulatory Visit: Payer: Self-pay | Admitting: Gastroenterology

## 2020-10-10 ENCOUNTER — Ambulatory Visit (HOSPITAL_COMMUNITY)
Admission: RE | Admit: 2020-10-10 | Discharge: 2020-10-10 | Disposition: A | Discharge status: Home / Self Care | Payer: 59 | Source: Ambulatory Visit | Attending: Urology | Admitting: Urology

## 2020-10-10 ENCOUNTER — Other Ambulatory Visit: Payer: Self-pay

## 2020-10-10 ENCOUNTER — Encounter: Payer: Self-pay | Admitting: Urology

## 2020-10-10 ENCOUNTER — Ambulatory Visit (INDEPENDENT_AMBULATORY_CARE_PROVIDER_SITE_OTHER): Payer: 59 | Admitting: Urology

## 2020-10-10 VITALS — BP 149/70 | HR 53 | Temp 97.5°F | Ht 69.0 in | Wt 160.0 lb

## 2020-10-10 DIAGNOSIS — N200 Calculus of kidney: Secondary | ICD-10-CM | POA: Diagnosis present

## 2020-10-10 LAB — URINALYSIS, ROUTINE W REFLEX MICROSCOPIC
Bilirubin, UA: NEGATIVE
Glucose, UA: NEGATIVE
Ketones, UA: NEGATIVE
Leukocytes,UA: NEGATIVE
Nitrite, UA: NEGATIVE
Protein,UA: NEGATIVE
Specific Gravity, UA: 1.015 (ref 1.005–1.030)
Urobilinogen, Ur: 0.2 mg/dL (ref 0.2–1.0)
pH, UA: 6 (ref 5.0–7.5)

## 2020-10-10 LAB — MICROSCOPIC EXAMINATION
Bacteria, UA: NONE SEEN
Epithelial Cells (non renal): NONE SEEN /hpf (ref 0–10)
RBC, Urine: >30 /hpf — AB (ref 0–2)
Renal Epithel, UA: NONE SEEN /hpf
WBC, UA: NONE SEEN /hpf (ref 0–5)

## 2020-10-10 MED ORDER — OXYCODONE-ACETAMINOPHEN 5-325 MG PO TABS: 1.0000 | ORAL_TABLET | ORAL | 0 refills | Status: DC | PRN

## 2020-10-10 MED ORDER — ONDANSETRON 4 MG PO TBDP: 4.0000 mg | ORAL_TABLET | Freq: Three times a day (TID) | ORAL | 0 refills | Status: DC | PRN

## 2020-10-10 MED ORDER — TAMSULOSIN HCL 0.4 MG PO CAPS: 0.4000 mg | ORAL_CAPSULE | Freq: Every day | ORAL | 1 refills | Status: DC

## 2020-10-10 NOTE — H&P (View-Only) (Signed)
10/10/2020 12:07 PM   Travis Warner [birth year] [id number]  Referring provider: Sharilyn Sites, MD [address] [place],  [place] [address]  Left flank pain  HPI: [name] is a 59yo here for evaluation of left flank pain. His first stone events was over 20 years ago. He last passed a right sided calculus 1 month ago. 6:30am this morning he developed severe sharp intermittent left flank pain. He had associated nausea and vomiting this morning. He took an oxycodone which improved his flank pain. He has darker urine. No dysuria, urinary frequency, urinary urgency.    PMH: Past Medical History:  Diagnosis Date  . Cancer ([place])    skin cancer- basal cell on face  . Chronic kidney disease    kidney stone  . Hypercholesterolemia   . Hypertension   . Insomnia     Surgical History: Past Surgical History:  Procedure Laterality Date  . COLONOSCOPY  02/24/2012   Procedure: COLONOSCOPY;  Surgeon: Daneil Dolin, MD;  [facility];  Service: Endoscopy;  Laterality: N/A;  11:30 AM  . COLONOSCOPY N/A 04/22/2017   Procedure: COLONOSCOPY;  Surgeon: Daneil Dolin, MD;  2 sessile serrated polyp/adenomas with high grade dysplasia. Due for repeat in 2021.   Marland Kitchen COLONOSCOPY WITH PROPOFOL N/A 04/01/2020   Procedure: COLONOSCOPY WITH PROPOFOL;  Surgeon: Daneil Dolin, MD;  [facility];  Service: Endoscopy;  Laterality: N/A;  2:00pm - pt knows to arrive at 8:30  . POLYPECTOMY  04/22/2017   Procedure: POLYPECTOMY;  Surgeon: Daneil Dolin, MD;  [facility];  Service: Endoscopy;;  colon  . POLYPECTOMY  04/01/2020   Procedure: POLYPECTOMY;  Surgeon: Daneil Dolin, MD;  [facility];  Service: Endoscopy;;  . Skin cancer removed from face      Home Medications:  Allergies as of 10/10/2020   No Known Allergies     Medication List       Accurate as of October 10, 2020 12:07 PM. If you have any questions, ask your nurse or doctor.        ALPRAZolam  1 MG tablet Commonly known as: XANAX Take 1 mg by mouth daily as needed for anxiety.   multivitamin with minerals Tabs tablet Take 2 tablets by mouth daily.   olmesartan 20 MG tablet Commonly known as: BENICAR Take 20 mg by mouth daily.   polyethylene glycol-electrolytes 420 g solution Commonly known as: TriLyte Take 4,000 mLs by mouth as directed.   rosuvastatin 20 MG tablet Commonly known as: CRESTOR Take 20 mg by mouth at bedtime.   zolpidem 10 MG tablet Commonly known as: AMBIEN Take 10 mg by mouth at bedtime as needed for sleep.       Allergies: No Known Allergies  Family History: Family History  Problem Relation Age of Onset  . COPD Mother   . Liver cancer Father   . Colon cancer Neg Hx     Social History:  reports that he has never smoked. He has never used smokeless tobacco. He reports that he does not drink alcohol and does not use drugs.  ROS: All other review of systems were reviewed and are negative except what is noted above in HPI  Physical Exam: BP (!) 149/70   Pulse (!) 53   Temp (!) 97.5 F (36.4 C)   Ht 5\' 9"  (1.753 m)   Wt 160 lb (72.6 kg)   BMI 23.63 kg/m   Constitutional:  Alert and  oriented, No acute distress. HEENT: [place] AT, moist mucus membranes.  Trachea midline, no masses. Cardiovascular: No clubbing, cyanosis, or edema. Respiratory: Normal respiratory effort, no increased work of breathing. GI: Abdomen is soft, nontender, nondistended, no abdominal masses GU: No CVA tenderness.  Lymph: No cervical or inguinal lymphadenopathy. Skin: No rashes, bruises or suspicious lesions. Neurologic: Grossly intact, no focal deficits, moving all 4 extremities. Psychiatric: Normal mood and affect.  Laboratory Data: Lab Results  Component Value Date   WBC 4.8 03/07/2019   HGB 14.2 03/07/2019   HCT 43.1 03/07/2019   MCV 91.7 03/07/2019   PLT 221 03/07/2019    Lab Results  Component Value Date   CREATININE 1.12 03/07/2019    No results  found for: PSA  No results found for: TESTOSTERONE  No results found for: HGBA1C  Urinalysis    Component Value Date/Time   COLORURINE YELLOW 12/26/2011 [place] 12/26/2011 1302   LABSPEC 1.010 12/26/2011 1302   PHURINE 6.0 12/26/2011 1302   GLUCOSEU NEGATIVE 12/26/2011 1302   HGBUR MODERATE (A) 12/26/2011 1302   BILIRUBINUR NEGATIVE 12/26/2011 1302   KETONESUR NEGATIVE 12/26/2011 1302   PROTEINUR NEGATIVE 12/26/2011 1302   UROBILINOGEN 0.2 12/26/2011 1302   NITRITE NEGATIVE 12/26/2011 1302   LEUKOCYTESUR NEGATIVE 12/26/2011 1302    No results found for: LABMICR, WBCUA, RBCUA, LABEPIT, MUCUS, BACTERIA  Pertinent Imaging:  No results found for this or any previous visit.  No results found for this or any previous visit.  No results found for this or any previous visit.  No results found for this or any previous visit.  Results for orders placed during the hospital encounter of 08/20/09  US Renal  Narrative Clinical Data: Urinary frequency.  RENAL/URINARY TRACT ULTRASOUND COMPLETE  Comparison: 08/22/2005 CT scan.  Findings:  Right Kidney:  11.6 cm in length. Normal renal cortical thickness and echogenicity without focal lesions or hydronephrosis.  Left Kidney:  10.0 cm in length. Normal renal cortical thickness and echogenicity without focal lesions or hydronephrosis.  Bladder:  Normal.  Bilateral ureteral jets are noted.  IMPRESSION: Unremarkable renal ultrasound examination.  Provider: Eldridge Scot  No results found for this or any previous visit.  No results found for this or any previous visit.  No results found for this or any previous visit.   Assessment & Plan:    1. Kidney stones --We discussed the management of kidney stones. These options include observation, ureteroscopy, shockwave lithotripsy (ESWL) and percutaneous nephrolithotomy (PCNL). We discussed which options are relevant to the patient's stone(s). We  discussed the natural history of kidney stones as well as the complications of untreated stones and the impact on quality of life without treatment as well as with each of the above listed treatments. We also discussed the efficacy of each treatment in its ability to clear the stone burden. With any of these management options I discussed the signs and symptoms of infection and the need for emergent treatment should these be experienced. For each option we discussed the ability of each procedure to clear the patient of their stone burden.   For observation I described the risks which include but are not limited to silent renal damage, life-threatening infection, need for emergent surgery, failure to pass stone and pain.   For ureteroscopy I described the risks which include bleeding, infection, damage to contiguous structures, positioning injury, ureteral stricture, ureteral avulsion, ureteral injury, need for prolonged ureteral stent, inability to perform ureteroscopy, need for an interval procedure,  inability to clear stone burden, stent discomfort/pain, heart attack, stroke, pulmonary embolus and the inherent risks with general anesthesia.   For shockwave lithotripsy I described the risks which include arrhythmia, kidney contusion, kidney hemorrhage, need for transfusion, pain, inability to adequately break up stone, inability to pass stone fragments, Steinstrasse, infection associated with obstructing stones, need for alternate surgical procedure, need for repeat shockwave lithotripsy, MI, CVA, PE and the inherent risks with anesthesia/conscious sedation.   For PCNL I described the risks including positioning injury, pneumothorax, hydrothorax, need for chest tube, inability to clear stone burden, renal laceration, arterial venous fistula or malformation, need for embolization of kidney, loss of kidney or renal function, need for repeat procedure, need for prolonged nephrostomy tube, ureteral avulsion,  MI, CVA, PE and the inherent risks of general anesthesia.   - The patient would like to proceed with left ureteroscopic stone extraction  - Urinalysis, Routine w reflex microscopic   No follow-ups on file.  Nicolette Bang, MD  [facility] [place]

## 2020-10-10 NOTE — H&P (View-Only) (Signed)
10/10/2020 12:07 PM   Travis Warner 21-May-1961 [id number]  Referring provider: Sharilyn Sites, MD [address] [place],  [place] [address]  Left flank pain  HPI: [name] is a 59yo here for evaluation of left flank pain. His first stone events was over 20 years ago. He last passed a right sided calculus 1 month ago. 6:30am this morning he developed severe sharp intermittent left flank pain. He had associated nausea and vomiting this morning. He took an oxycodone which improved his flank pain. He has darker urine. No dysuria, urinary frequency, urinary urgency.    PMH: Past Medical History:  Diagnosis Date  . Cancer ([place])    skin cancer- basal cell on face  . Chronic kidney disease    kidney stone  . Hypercholesterolemia   . Hypertension   . Insomnia     Surgical History: Past Surgical History:  Procedure Laterality Date  . COLONOSCOPY  02/24/2012   Procedure: COLONOSCOPY;  Surgeon: Daneil Dolin, MD;  [facility];  Service: Endoscopy;  Laterality: N/A;  11:30 AM  . COLONOSCOPY N/A 04/22/2017   Procedure: COLONOSCOPY;  Surgeon: Daneil Dolin, MD;  2 sessile serrated polyp/adenomas with high grade dysplasia. Due for repeat in 2021.   Marland Kitchen COLONOSCOPY WITH PROPOFOL N/A 04/01/2020   Procedure: COLONOSCOPY WITH PROPOFOL;  Surgeon: Daneil Dolin, MD;  [facility];  Service: Endoscopy;  Laterality: N/A;  2:00pm - pt knows to arrive at 8:30  . POLYPECTOMY  04/22/2017   Procedure: POLYPECTOMY;  Surgeon: Daneil Dolin, MD;  [facility];  Service: Endoscopy;;  colon  . POLYPECTOMY  04/01/2020   Procedure: POLYPECTOMY;  Surgeon: Daneil Dolin, MD;  [facility];  Service: Endoscopy;;  . Skin cancer removed from face      Home Medications:  Allergies as of 10/10/2020   No Known Allergies     Medication List       Accurate as of October 10, 2020 12:07 PM. If you have any questions, ask your nurse or doctor.        ALPRAZolam  1 MG tablet Commonly known as: XANAX Take 1 mg by mouth daily as needed for anxiety.   multivitamin with minerals Tabs tablet Take 2 tablets by mouth daily.   olmesartan 20 MG tablet Commonly known as: BENICAR Take 20 mg by mouth daily.   polyethylene glycol-electrolytes 420 g solution Commonly known as: TriLyte Take 4,000 mLs by mouth as directed.   rosuvastatin 20 MG tablet Commonly known as: CRESTOR Take 20 mg by mouth at bedtime.   zolpidem 10 MG tablet Commonly known as: AMBIEN Take 10 mg by mouth at bedtime as needed for sleep.       Allergies: No Known Allergies  Family History: Family History  Problem Relation Age of Onset  . COPD Mother   . Liver cancer Father   . Colon cancer Neg Hx     Social History:  reports that he has never smoked. He has never used smokeless tobacco. He reports that he does not drink alcohol and does not use drugs.  ROS: All other review of systems were reviewed and are negative except what is noted above in HPI  Physical Exam: BP (!) 149/70   Pulse (!) 53   Temp (!) 97.5 F (36.4 C)   Ht 5\' 9"  (1.753 m)   Wt 160 lb (72.6 kg)   BMI 23.63 kg/m   Constitutional:  Alert and  oriented, No acute distress. HEENT: [place] AT, moist mucus membranes.  Trachea midline, no masses. Cardiovascular: No clubbing, cyanosis, or edema. Respiratory: Normal respiratory effort, no increased work of breathing. GI: Abdomen is soft, nontender, nondistended, no abdominal masses GU: No CVA tenderness.  Lymph: No cervical or inguinal lymphadenopathy. Skin: No rashes, bruises or suspicious lesions. Neurologic: Grossly intact, no focal deficits, moving all 4 extremities. Psychiatric: Normal mood and affect.  Laboratory Data: Lab Results  Component Value Date   WBC 4.8 03/07/2019   HGB 14.2 03/07/2019   HCT 43.1 03/07/2019   MCV 91.7 03/07/2019   PLT 221 03/07/2019    Lab Results  Component Value Date   CREATININE 1.12 03/07/2019    No results  found for: PSA  No results found for: TESTOSTERONE  No results found for: HGBA1C  Urinalysis    Component Value Date/Time   COLORURINE YELLOW 12/26/2011 [place] 12/26/2011 1302   LABSPEC 1.010 12/26/2011 1302   PHURINE 6.0 12/26/2011 1302   GLUCOSEU NEGATIVE 12/26/2011 1302   HGBUR MODERATE (A) 12/26/2011 1302   BILIRUBINUR NEGATIVE 12/26/2011 1302   KETONESUR NEGATIVE 12/26/2011 1302   PROTEINUR NEGATIVE 12/26/2011 1302   UROBILINOGEN 0.2 12/26/2011 1302   NITRITE NEGATIVE 12/26/2011 1302   LEUKOCYTESUR NEGATIVE 12/26/2011 1302    No results found for: LABMICR, WBCUA, RBCUA, LABEPIT, MUCUS, BACTERIA  Pertinent Imaging:  No results found for this or any previous visit.  No results found for this or any previous visit.  No results found for this or any previous visit.  No results found for this or any previous visit.  Results for orders placed during the hospital encounter of 08/20/09  US Renal  Narrative Clinical Data: Urinary frequency.  RENAL/URINARY TRACT ULTRASOUND COMPLETE  Comparison: 08/22/2005 CT scan.  Findings:  Right Kidney:  11.6 cm in length. Normal renal cortical thickness and echogenicity without focal lesions or hydronephrosis.  Left Kidney:  10.0 cm in length. Normal renal cortical thickness and echogenicity without focal lesions or hydronephrosis.  Bladder:  Normal.  Bilateral ureteral jets are noted.  IMPRESSION: Unremarkable renal ultrasound examination.  Provider: Eldridge Scot  No results found for this or any previous visit.  No results found for this or any previous visit.  No results found for this or any previous visit.   Assessment & Plan:    1. Kidney stones --We discussed the management of kidney stones. These options include observation, ureteroscopy, shockwave lithotripsy (ESWL) and percutaneous nephrolithotomy (PCNL). We discussed which options are relevant to the patient's stone(s). We  discussed the natural history of kidney stones as well as the complications of untreated stones and the impact on quality of life without treatment as well as with each of the above listed treatments. We also discussed the efficacy of each treatment in its ability to clear the stone burden. With any of these management options I discussed the signs and symptoms of infection and the need for emergent treatment should these be experienced. For each option we discussed the ability of each procedure to clear the patient of their stone burden.   For observation I described the risks which include but are not limited to silent renal damage, life-threatening infection, need for emergent surgery, failure to pass stone and pain.   For ureteroscopy I described the risks which include bleeding, infection, damage to contiguous structures, positioning injury, ureteral stricture, ureteral avulsion, ureteral injury, need for prolonged ureteral stent, inability to perform ureteroscopy, need for an interval procedure,  inability to clear stone burden, stent discomfort/pain, heart attack, stroke, pulmonary embolus and the inherent risks with general anesthesia.   For shockwave lithotripsy I described the risks which include arrhythmia, kidney contusion, kidney hemorrhage, need for transfusion, pain, inability to adequately break up stone, inability to pass stone fragments, Steinstrasse, infection associated with obstructing stones, need for alternate surgical procedure, need for repeat shockwave lithotripsy, MI, CVA, PE and the inherent risks with anesthesia/conscious sedation.   For PCNL I described the risks including positioning injury, pneumothorax, hydrothorax, need for chest tube, inability to clear stone burden, renal laceration, arterial venous fistula or malformation, need for embolization of kidney, loss of kidney or renal function, need for repeat procedure, need for prolonged nephrostomy tube, ureteral avulsion,  MI, CVA, PE and the inherent risks of general anesthesia.   - The patient would like to proceed with left ureteroscopic stone extraction  - Urinalysis, Routine w reflex microscopic   No follow-ups on file.  Nicolette Bang, MD  [facility] [place]

## 2020-10-10 NOTE — Progress Notes (Signed)
Urological Symptom Review  Patient is experiencing the following symptoms: Blood in urine  Kidney stones   Review of Systems  Gastrointestinal (upper)  : Nausea Vomiting  Gastrointestinal (lower) : Negative for lower GI symptoms  Constitutional : Negative for symptoms  Skin: Negative for skin symptoms  Eyes: Negative for eye symptoms  Ear/Nose/Throat : Negative for Ear/Nose/Throat symptoms  Hematologic/Lymphatic: Negative for Hematologic/Lymphatic symptoms  Cardiovascular : Negative for cardiovascular symptoms  Respiratory : Negative for respiratory symptoms  Endocrine: Negative for endocrine symptoms  Musculoskeletal: Negative for musculoskeletal symptoms  Neurological: Negative for neurological symptoms  Psychologic: Negative for psychiatric symptoms

## 2020-10-10 NOTE — Progress Notes (Signed)
10/10/2020 12:07 PM   Travis Warner 1961/08/24 [id number]  Referring provider: Sharilyn Sites, MD [address] [place],  [place] [address]  Left flank pain  HPI: [name] is a 59yo here for evaluation of left flank pain. His first stone events was over 20 years ago. He last passed a right sided calculus 1 month ago. 6:30am this morning he developed severe sharp intermittent left flank pain. He had associated nausea and vomiting this morning. He took an oxycodone which improved his flank pain. He has darker urine. No dysuria, urinary frequency, urinary urgency.    PMH: Past Medical History:  Diagnosis Date  . Cancer (Travis Warner)    skin cancer- basal cell on face  . Chronic kidney disease    kidney stone  . Hypercholesterolemia   . Hypertension   . Insomnia     Surgical History: Past Surgical History:  Procedure Laterality Date  . COLONOSCOPY  02/24/2012   Procedure: COLONOSCOPY;  Surgeon: Travis Dolin, MD;  [facility]: [facility];  Service: Endoscopy;  Laterality: N/A;  11:30 AM  . COLONOSCOPY N/A 04/22/2017   Procedure: COLONOSCOPY;  Surgeon: Travis Dolin, MD;  2 sessile serrated polyp/adenomas with high grade dysplasia. Due for repeat in 2021.   Marland Kitchen COLONOSCOPY WITH PROPOFOL N/A 04/01/2020   Procedure: COLONOSCOPY WITH PROPOFOL;  Surgeon: Travis Dolin, MD;  Location: [facility];  Service: Endoscopy;  Laterality: N/A;  2:00pm - pt knows to arrive at 8:30  . POLYPECTOMY  04/22/2017   Procedure: POLYPECTOMY;  Surgeon: Travis Dolin, MD;  [facility]: [facility];  Service: Endoscopy;;  colon  . POLYPECTOMY  04/01/2020   Procedure: POLYPECTOMY;  Surgeon: Travis Dolin, MD;  Location: [facility];  Service: Endoscopy;;  . Skin cancer removed from face      Home Medications:  Allergies as of 10/10/2020   No Known Allergies     Medication List       Accurate as of October 10, 2020 12:07 PM. If you have any questions, ask your nurse or doctor.        ALPRAZolam  1 MG tablet Commonly known as: XANAX Take 1 mg by mouth daily as needed for anxiety.   multivitamin with minerals Tabs tablet Take 2 tablets by mouth daily.   olmesartan 20 MG tablet Commonly known as: BENICAR Take 20 mg by mouth daily.   polyethylene glycol-electrolytes 420 g solution Commonly known as: TriLyte Take 4,000 mLs by mouth as directed.   rosuvastatin 20 MG tablet Commonly known as: CRESTOR Take 20 mg by mouth at bedtime.   zolpidem 10 MG tablet Commonly known as: AMBIEN Take 10 mg by mouth at bedtime as needed for sleep.       Allergies: No Known Allergies  Family History: Family History  Problem Relation Age of Onset  . COPD Mother   . Liver cancer Father   . Colon cancer Neg Hx     Social History:  reports that he has never smoked. He has never used smokeless tobacco. He reports that he does not drink alcohol and does not use drugs.  ROS: All other review of systems were reviewed and are negative except what is noted above in HPI  Physical Exam: BP (!) 149/70   Pulse (!) 53   Temp (!) 97.5 F (36.4 C)   Ht 5\' 9"  (1.753 m)   Wt 160 lb (72.6 kg)   BMI 23.63 kg/m   Constitutional:  Alert and  oriented, No acute distress. HEENT: Travis Warner AT, moist mucus membranes.  Trachea midline, no masses. Cardiovascular: No clubbing, cyanosis, or edema. Respiratory: Normal respiratory effort, no increased work of breathing. GI: Abdomen is soft, nontender, nondistended, no abdominal masses GU: No CVA tenderness.  Lymph: No cervical or inguinal lymphadenopathy. Skin: No rashes, bruises or suspicious lesions. Neurologic: Grossly intact, no focal deficits, moving all 4 extremities. Psychiatric: Normal mood and affect.  Laboratory Data: Lab Results  Component Value Date   WBC 4.8 03/07/2019   HGB 14.2 03/07/2019   HCT 43.1 03/07/2019   MCV 91.7 03/07/2019   PLT 221 03/07/2019    Lab Results  Component Value Date   CREATININE 1.12 03/07/2019    No results  found for: PSA  No results found for: TESTOSTERONE  No results found for: HGBA1C  Urinalysis    Component Value Date/Time   COLORURINE YELLOW 12/26/2011 [place] 12/26/2011 1302   LABSPEC 1.010 12/26/2011 1302   PHURINE 6.0 12/26/2011 1302   GLUCOSEU NEGATIVE 12/26/2011 1302   HGBUR MODERATE (A) 12/26/2011 1302   BILIRUBINUR NEGATIVE 12/26/2011 1302   KETONESUR NEGATIVE 12/26/2011 1302   PROTEINUR NEGATIVE 12/26/2011 1302   UROBILINOGEN 0.2 12/26/2011 1302   NITRITE NEGATIVE 12/26/2011 1302   LEUKOCYTESUR NEGATIVE 12/26/2011 1302    No results found for: LABMICR, WBCUA, RBCUA, LABEPIT, MUCUS, BACTERIA  Pertinent Imaging:  No results found for this or any previous visit.  No results found for this or any previous visit.  No results found for this or any previous visit.  No results found for this or any previous visit.  Results for orders placed during the hospital encounter of 08/20/09  US Renal  Narrative Clinical Data: Urinary frequency.  RENAL/URINARY TRACT ULTRASOUND COMPLETE  Comparison: 08/22/2005 CT scan.  Findings:  Right Kidney:  11.6 cm in length. Normal renal cortical thickness and echogenicity without focal lesions or hydronephrosis.  Left Kidney:  10.0 cm in length. Normal renal cortical thickness and echogenicity without focal lesions or hydronephrosis.  Bladder:  Normal.  Bilateral ureteral jets are noted.  IMPRESSION: Unremarkable renal ultrasound examination.  Provider: Eldridge Warner  No results found for this or any previous visit.  No results found for this or any previous visit.  No results found for this or any previous visit.   Assessment & Plan:    1. Kidney stones --We discussed the management of kidney stones. These options include observation, ureteroscopy, shockwave lithotripsy (ESWL) and percutaneous nephrolithotomy (PCNL). We discussed which options are relevant to the patient's stone(s). We  discussed the natural history of kidney stones as well as the complications of untreated stones and the impact on quality of life without treatment as well as with each of the above listed treatments. We also discussed the efficacy of each treatment in its ability to clear the stone burden. With any of these management options I discussed the signs and symptoms of infection and the need for emergent treatment should these be experienced. For each option we discussed the ability of each procedure to clear the patient of their stone burden.   For observation I described the risks which include but are not limited to silent renal damage, life-threatening infection, need for emergent surgery, failure to pass stone and pain.   For ureteroscopy I described the risks which include bleeding, infection, damage to contiguous structures, positioning injury, ureteral stricture, ureteral avulsion, ureteral injury, need for prolonged ureteral stent, inability to perform ureteroscopy, need for an interval procedure,  inability to clear stone burden, stent discomfort/pain, heart attack, stroke, pulmonary embolus and the inherent risks with general anesthesia.   For shockwave lithotripsy I described the risks which include arrhythmia, kidney contusion, kidney hemorrhage, need for transfusion, pain, inability to adequately break up stone, inability to pass stone fragments, Steinstrasse, infection associated with obstructing stones, need for alternate surgical procedure, need for repeat shockwave lithotripsy, MI, CVA, PE and the inherent risks with anesthesia/conscious sedation.   For PCNL I described the risks including positioning injury, pneumothorax, hydrothorax, need for chest tube, inability to clear stone burden, renal laceration, arterial venous fistula or malformation, need for embolization of kidney, loss of kidney or renal function, need for repeat procedure, need for prolonged nephrostomy tube, ureteral avulsion,  MI, CVA, PE and the inherent risks of general anesthesia.   - The patient would like to proceed with left ureteroscopic stone extraction  - Urinalysis, Routine w reflex microscopic   No follow-ups on file.  Nicolette Bang, MD  [facility] [place]

## 2020-10-10 NOTE — Patient Instructions (Signed)
Dietary Guidelines to Help Prevent Kidney Stones Kidney stones are deposits of minerals and salts that form inside your kidneys. Your risk of developing kidney stones may be greater depending on your diet, your lifestyle, the medicines you take, and whether you have certain medical conditions. Most people can reduce their chances of developing kidney stones by following the instructions below. Depending on your overall health and the type of kidney stones you tend to develop, your dietitian may give you more specific instructions. What are tips for following this plan? Reading food labels  Choose foods with "no salt added" or "low-salt" labels. Limit your sodium intake to less than 1500 mg per day.  Choose foods with calcium for each meal and snack. Try to eat about 300 mg of calcium at each meal. Foods that contain 200-500 mg of calcium per serving include: ? 8 oz (237 ml) of milk, fortified nondairy milk, and fortified fruit juice. ? 8 oz (237 ml) of kefir, yogurt, and soy yogurt. ? 4 oz (118 ml) of tofu. ? 1 oz of cheese. ? 1 cup (300 g) of dried figs. ? 1 cup (91 g) of cooked broccoli. ? 1-3 oz can of sardines or mackerel.  Most people need 1000 to 1500 mg of calcium each day. Talk to your dietitian about how much calcium is recommended for you. Shopping  Buy plenty of fresh fruits and vegetables. Most people do not need to avoid fruits and vegetables, even if they contain nutrients that may contribute to kidney stones.  When shopping for convenience foods, choose: ? Whole pieces of fruit. ? Premade salads with dressing on the side. ? Low-fat fruit and yogurt smoothies.  Avoid buying frozen meals or prepared deli foods.  Look for foods with live cultures, such as yogurt and kefir. Cooking  Do not add salt to food when cooking. Place a salt shaker on the table and allow each person to add his or her own salt to taste.  Use vegetable protein, such as beans, textured vegetable  protein (TVP), or tofu instead of meat in pasta, casseroles, and soups. Meal planning   Eat less salt, if told by your dietitian. To do this: ? Avoid eating processed or premade food. ? Avoid eating fast food.  Eat less animal protein, including cheese, meat, poultry, or fish, if told by your dietitian. To do this: ? Limit the number of times you have meat, poultry, fish, or cheese each week. Eat a diet free of meat at least 2 days a week. ? Eat only one serving each day of meat, poultry, fish, or seafood. ? When you prepare animal protein, cut pieces into small portion sizes. For most meat and fish, one serving is about the size of one deck of cards.  Eat at least 5 servings of fresh fruits and vegetables each day. To do this: ? Keep fruits and vegetables on hand for snacks. ? Eat 1 piece of fruit or a handful of berries with breakfast. ? Have a salad and fruit at lunch. ? Have two kinds of vegetables at dinner.  Limit foods that are high in a substance called oxalate. These include: ? Spinach. ? Rhubarb. ? Beets. ? Potato chips and french fries. ? Nuts.  If you regularly take a diuretic medicine, make sure to eat at least 1-2 fruits or vegetables high in potassium each day. These include: ? Avocado. ? Banana. ? Orange, prune, carrot, or tomato juice. ? Baked potato. ? Cabbage. ? Beans and split   peas. General instructions   Drink enough fluid to keep your urine clear or pale yellow. This is the most important thing you can do.  Talk to your health care provider and dietitian about taking daily supplements. Depending on your health and the cause of your kidney stones, you may be advised: ? Not to take supplements with vitamin C. ? To take a calcium supplement. ? To take a daily probiotic supplement. ? To take other supplements such as magnesium, fish oil, or vitamin B6.  Take all medicines and supplements as told by your health care provider.  Limit alcohol intake to no  more than 1 drink a day for nonpregnant women and 2 drinks a day for men. One drink equals 12 oz of beer, 5 oz of wine, or 1 oz of hard liquor.  Lose weight if told by your health care provider. Work with your dietitian to find strategies and an eating plan that works best for you. What foods are not recommended? Limit your intake of the following foods, or as told by your dietitian. Talk to your dietitian about specific foods you should avoid based on the type of kidney stones and your overall health. Grains Breads. Bagels. Rolls. Baked goods. Salted crackers. Cereal. Pasta. Vegetables Spinach. Rhubarb. Beets. Canned vegetables. Pickles. Olives. Meats and other protein foods Nuts. Nut butters. Large portions of meat, poultry, or fish. Salted or cured meats. Deli meats. Hot dogs. Sausages. Dairy Cheese. Beverages Regular soft drinks. Regular vegetable juice. Seasonings and other foods Seasoning blends with salt. Salad dressings. Canned soups. Soy sauce. Ketchup. Barbecue sauce. Canned pasta sauce. Casseroles. Pizza. Lasagna. Frozen meals. Potato chips. French fries. Summary  You can reduce your risk of kidney stones by making changes to your diet.  The most important thing you can do is drink enough fluid. You should drink enough fluid to keep your urine clear or pale yellow.  Ask your health care provider or dietitian how much protein from animal sources you should eat each day, and also how much salt and calcium you should have each day. This information is not intended to replace advice given to you by your health care provider. Make sure you discuss any questions you have with your health care provider. Document Revised: 04/05/2019 Document Reviewed: 11/24/2016 Elsevier Patient Education  2020 Elsevier [employer]  

## 2020-10-14 ENCOUNTER — Telehealth: Payer: Self-pay

## 2020-10-14 ENCOUNTER — Other Ambulatory Visit: Payer: Self-pay

## 2020-10-14 ENCOUNTER — Ambulatory Visit: Admit: 2020-10-14 | Payer: 59 | Admitting: Urology

## 2020-10-14 ENCOUNTER — Emergency Department (HOSPITAL_COMMUNITY): Payer: 59 | Admitting: Anesthesiology

## 2020-10-14 ENCOUNTER — Emergency Department (HOSPITAL_COMMUNITY): Payer: 59

## 2020-10-14 ENCOUNTER — Encounter (HOSPITAL_COMMUNITY)
Admission: EM | Disposition: A | Discharge status: Home / Self Care | Payer: Self-pay | Source: Home / Self Care | Attending: Emergency Medicine

## 2020-10-14 ENCOUNTER — Emergency Department (HOSPITAL_COMMUNITY)
Admission: EM | Admit: 2020-10-14 | Discharge: 2020-10-14 | Disposition: A | Discharge status: Home / Self Care | Payer: 59 | Attending: Emergency Medicine | Admitting: Emergency Medicine

## 2020-10-14 ENCOUNTER — Encounter (HOSPITAL_COMMUNITY): Payer: Self-pay

## 2020-10-14 DIAGNOSIS — N201 Calculus of ureter: Secondary | ICD-10-CM

## 2020-10-14 DIAGNOSIS — R109 Unspecified abdominal pain: Secondary | ICD-10-CM | POA: Diagnosis present

## 2020-10-14 DIAGNOSIS — N200 Calculus of kidney: Secondary | ICD-10-CM

## 2020-10-14 DIAGNOSIS — N132 Hydronephrosis with renal and ureteral calculous obstruction: Secondary | ICD-10-CM | POA: Insufficient documentation

## 2020-10-14 DIAGNOSIS — N189 Chronic kidney disease, unspecified: Secondary | ICD-10-CM | POA: Diagnosis not present

## 2020-10-14 DIAGNOSIS — K219 Gastro-esophageal reflux disease without esophagitis: Secondary | ICD-10-CM | POA: Insufficient documentation

## 2020-10-14 DIAGNOSIS — I129 Hypertensive chronic kidney disease with stage 1 through stage 4 chronic kidney disease, or unspecified chronic kidney disease: Secondary | ICD-10-CM | POA: Diagnosis not present

## 2020-10-14 DIAGNOSIS — Z79899 Other long term (current) drug therapy: Secondary | ICD-10-CM | POA: Insufficient documentation

## 2020-10-14 DIAGNOSIS — Z86010 Personal history of colonic polyps: Secondary | ICD-10-CM | POA: Diagnosis not present

## 2020-10-14 DIAGNOSIS — G4700 Insomnia, unspecified: Secondary | ICD-10-CM | POA: Insufficient documentation

## 2020-10-14 DIAGNOSIS — Z825 Family history of asthma and other chronic lower respiratory diseases: Secondary | ICD-10-CM | POA: Insufficient documentation

## 2020-10-14 DIAGNOSIS — Z800 Family history of malignant neoplasm of digestive organs: Secondary | ICD-10-CM | POA: Insufficient documentation

## 2020-10-14 DIAGNOSIS — E7800 Pure hypercholesterolemia, unspecified: Secondary | ICD-10-CM | POA: Insufficient documentation

## 2020-10-14 DIAGNOSIS — Z20822 Contact with and (suspected) exposure to covid-19: Secondary | ICD-10-CM | POA: Insufficient documentation

## 2020-10-14 HISTORY — PX: CYSTOSCOPY WITH RETROGRADE PYELOGRAM, URETEROSCOPY AND STENT PLACEMENT: SHX5789

## 2020-10-14 LAB — URINALYSIS, ROUTINE W REFLEX MICROSCOPIC
Bacteria, UA: NONE SEEN
Bilirubin Urine: NEGATIVE
Glucose, UA: NEGATIVE mg/dL
Ketones, ur: NEGATIVE mg/dL
Leukocytes,Ua: NEGATIVE
Nitrite: NEGATIVE
Protein, ur: NEGATIVE mg/dL
Specific Gravity, Urine: 1.016 (ref 1.005–1.030)
pH: 5 (ref 5.0–8.0)

## 2020-10-14 LAB — RESPIRATORY PANEL BY RT PCR (FLU A&B, COVID)
Influenza A by PCR: NEGATIVE
Influenza B by PCR: NEGATIVE
SARS Coronavirus 2 by RT PCR: NEGATIVE

## 2020-10-14 SURGERY — CYSTOURETEROSCOPY, WITH RETROGRADE PYELOGRAM AND STENT INSERTION
Anesthesia: General | Site: Ureter | Laterality: Left

## 2020-10-14 MED ORDER — ORAL CARE MOUTH RINSE: 15.0000 mL | Freq: Once | OROMUCOSAL | Status: DC

## 2020-10-14 MED ORDER — LACTATED RINGERS IV SOLN: INTRAVENOUS | Status: DC

## 2020-10-14 MED ORDER — SODIUM CHLORIDE 0.9 % IV SOLN: Freq: Once | INTRAVENOUS | Status: AC

## 2020-10-14 MED ORDER — FENTANYL CITRATE (PF) 100 MCG/2ML IJ SOLN
INTRAMUSCULAR | Status: DC | PRN
  Administered 2020-10-14 (×2): 50 ug via INTRAVENOUS

## 2020-10-14 MED ORDER — OXYCODONE-ACETAMINOPHEN 5-325 MG PO TABS: 1.0000 | ORAL_TABLET | ORAL | 0 refills | Status: DC | PRN

## 2020-10-14 MED ORDER — KETOROLAC TROMETHAMINE 30 MG/ML IJ SOLN
30.0000 mg | Freq: Once | INTRAMUSCULAR | Status: AC
  Administered 2020-10-14: 30 mg via INTRAVENOUS
  Filled 2020-10-14: qty 1

## 2020-10-14 MED ORDER — FENTANYL CITRATE (PF) 100 MCG/2ML IJ SOLN: 25.0000 ug | INTRAMUSCULAR | Status: DC | PRN

## 2020-10-14 MED ORDER — PROPOFOL 10 MG/ML IV BOLUS
INTRAVENOUS | Status: DC | PRN
  Administered 2020-10-14: 30 mg via INTRAVENOUS
  Administered 2020-10-14: 150 mg via INTRAVENOUS

## 2020-10-14 MED ORDER — ONDANSETRON HCL 4 MG/2ML IJ SOLN: 4.0000 mg | Freq: Once | INTRAMUSCULAR | Status: DC | PRN

## 2020-10-14 MED ORDER — PROPOFOL 10 MG/ML IV BOLUS
INTRAVENOUS | Status: AC
  Filled 2020-10-14: qty 20

## 2020-10-14 MED ORDER — CHLORHEXIDINE GLUCONATE 0.12 % MT SOLN: 15.0000 mL | Freq: Once | OROMUCOSAL | Status: DC

## 2020-10-14 MED ORDER — CEFAZOLIN SODIUM-DEXTROSE 2-4 GM/100ML-% IV SOLN
2.0000 g | Freq: Three times a day (TID) | INTRAVENOUS | Status: DC
  Administered 2020-10-14: 2 g via INTRAVENOUS
  Filled 2020-10-14: qty 100

## 2020-10-14 MED ORDER — ONDANSETRON HCL 4 MG/2ML IJ SOLN
INTRAMUSCULAR | Status: DC | PRN
  Administered 2020-10-14: 4 mg via INTRAVENOUS

## 2020-10-14 MED ORDER — DIATRIZOATE MEGLUMINE 30 % UR SOLN
URETHRAL | Status: AC
  Filled 2020-10-14: qty 100

## 2020-10-14 MED ORDER — FENTANYL CITRATE (PF) 250 MCG/5ML IJ SOLN
INTRAMUSCULAR | Status: AC
  Filled 2020-10-14: qty 5

## 2020-10-14 MED ORDER — DIATRIZOATE MEGLUMINE 30 % UR SOLN
URETHRAL | Status: DC | PRN
  Administered 2020-10-14: 10 mL via URETHRAL

## 2020-10-14 SURGICAL SUPPLY — 24 items
BAG DRAIN URO TABLE W/ADPT NS (BAG) ×3 IMPLANT
BAG DRN 8 ADPR NS SKTRN CSTL (BAG) ×2
BAG HAMPER (MISCELLANEOUS) ×3 IMPLANT
CATH INTERMIT  6FR 70CM (CATHETERS) ×3 IMPLANT
CLOTH BEACON ORANGE TIMEOUT ST (SAFETY) ×3 IMPLANT
DECANTER SPIKE VIAL GLASS SM (MISCELLANEOUS) ×3 IMPLANT
EXTRACTOR STONE NITINOL NGAGE (UROLOGICAL SUPPLIES) IMPLANT
FIBER LASER FLEXIVA 200 (UROLOGICAL SUPPLIES) ×3 IMPLANT
GLOVE BIO SURGEON STRL SZ8 (GLOVE) ×3 IMPLANT
GLOVE BIOGEL PI IND STRL 7.0 (GLOVE) ×6 IMPLANT
GLOVE BIOGEL PI INDICATOR 7.0 (GLOVE) ×3
GOWN STRL REUS W/TWL LRG LVL3 (GOWN DISPOSABLE) ×3 IMPLANT
GOWN STRL REUS W/TWL XL LVL3 (GOWN DISPOSABLE) ×3 IMPLANT
GUIDEWIRE STR DUAL SENSOR (WIRE) ×3 IMPLANT
GUIDEWIRE STR ZIPWIRE 035X150 (MISCELLANEOUS) ×3 IMPLANT
IV NS IRRIG 3000ML ARTHROMATIC (IV SOLUTION) ×6 IMPLANT
KIT TURNOVER CYSTO (KITS) ×3 IMPLANT
MANIFOLD NEPTUNE II (INSTRUMENTS) ×3 IMPLANT
PACK CYSTO (CUSTOM PROCEDURE TRAY) ×3 IMPLANT
PAD ARMBOARD 7.5X6 YLW CONV (MISCELLANEOUS) ×3 IMPLANT
STENT URET 6FRX26 CONTOUR (STENTS) ×3 IMPLANT
SYR 10ML LL (SYRINGE) ×3 IMPLANT
TOWEL OR 17X26 4PK STRL BLUE (TOWEL DISPOSABLE) ×3 IMPLANT
WATER STERILE IRR 500ML POUR (IV SOLUTION) ×3 IMPLANT

## 2020-10-14 NOTE — Discharge Instructions (Signed)
Ureteral Stent Implantation, Care After This sheet gives you information about how to care for yourself after your procedure. Your health care provider may also give you more specific instructions. If you have problems or questions, contact your health care provider. What can I expect after the procedure? After the procedure, it is common to have:  Nausea.  Mild pain when you urinate. You may feel this pain in your lower back or lower abdomen. The pain should stop within a few minutes after you urinate. This may last for up to 1 week.  A small amount of blood in your urine for several days. Follow these instructions at home: Medicines  Take over-the-counter and prescription medicines only as told by your health care provider.  If you were prescribed an antibiotic medicine, take it as told by your health care provider. Do not stop taking the antibiotic even if you start to feel better.  Do not drive for 24 hours if you were given a sedative during your procedure.  Ask your health care provider if the medicine prescribed to you requires you to avoid driving or using heavy machinery. Activity  Rest as told by your health care provider.  Avoid sitting for a long time without moving. Get up to take short walks every 1-2 hours. This is important to improve blood flow and breathing. Ask for help if you feel weak or unsteady.  Return to your normal activities as told by your health care provider. Ask your health care provider what activities are safe for you. General instructions   Watch for any blood in your urine. Call your health care provider if the amount of blood in your urine increases.  If you have a catheter: ? Follow instructions from your health care provider about taking care of your catheter and collection bag. ? Do not take baths, swim, or use a hot tub until your health care provider approves. Ask your health care provider if you may take showers. You may only be allowed to  take sponge baths.  Drink enough fluid to keep your urine pale yellow.  Do not use any products that contain nicotine or tobacco, such as cigarettes, e-cigarettes, and chewing tobacco. These can delay healing after surgery. If you need help quitting, ask your health care provider.  Keep all follow-up visits as told by your health care provider. This is important. Contact a health care provider if:  You have pain that gets worse or does not get better with medicine, especially pain when you urinate.  You have difficulty urinating.  You feel nauseous or you vomit repeatedly during a period of more than 2 days after the procedure. Get help right away if:  Your urine is dark red or has blood clots in it.  You are leaking urine (have incontinence).  The end of the stent comes out of your urethra.  You cannot urinate.  You have sudden, sharp, or severe pain in your abdomen or lower back.  You have a fever.  You have swelling or pain in your legs.  You have difficulty breathing. Summary  After the procedure, it is common to have mild pain when you urinate that goes away within a few minutes after you urinate. This may last for up to 1 week.  Watch for any blood in your urine. Call your health care provider if the amount of blood in your urine increases.  Take over-the-counter and prescription medicines only as told by your health care provider.  Drink   enough fluid to keep your urine pale yellow. This information is not intended to replace advice given to you by your health care provider. Make sure you discuss any questions you have with your health care provider. Document Revised: 09/20/2018 Document Reviewed: 09/21/2018 Elsevier Patient Education  2020 [place] Anesthesia, Adult, Care After This sheet gives you information about how to care for yourself after your procedure. Your health care provider may also give you more specific instructions. If you have  problems or questions, contact your health care provider. What can I expect after the procedure? After the procedure, the following side effects are common:  Pain or discomfort at the IV site.  Nausea.  Vomiting.  Sore throat.  Trouble concentrating.  Feeling cold or chills.  Weak or tired.  Sleepiness and fatigue.  Soreness and body aches. These side effects can affect parts of the body that were not involved in surgery. Follow these instructions at home:  For at least 24 hours after the procedure:  Have a responsible adult stay with you. It is important to have someone help care for you until you are awake and alert.  Rest as needed.  Do not: ? Participate in activities in which you could fall or become injured. ? Drive. ? Use heavy machinery. ? Drink alcohol. ? Take sleeping pills or medicines that cause drowsiness. ? Make important decisions or sign legal documents. ? Take care of children on your own. Eating and drinking  Follow any instructions from your health care provider about eating or drinking restrictions.  When you feel hungry, start by eating small amounts of foods that are soft and easy to digest (bland), such as toast. Gradually return to your regular diet.  Drink enough fluid to keep your urine pale yellow.  If you vomit, rehydrate by drinking water, juice, or clear broth. General instructions  If you have sleep apnea, surgery and certain medicines can increase your risk for breathing problems. Follow instructions from your health care provider about wearing your sleep device: ? Anytime you are sleeping, including during daytime naps. ? While taking prescription pain medicines, sleeping medicines, or medicines that make you drowsy.  Return to your normal activities as told by your health care provider. Ask your health care provider what activities are safe for you.  Take over-the-counter and prescription medicines only as told by your health  care provider.  If you smoke, do not smoke without supervision.  Keep all follow-up visits as told by your health care provider. This is important. Contact a health care provider if:  You have nausea or vomiting that does not get better with medicine.  You cannot eat or drink without vomiting.  You have pain that does not get better with medicine.  You are unable to pass urine.  You develop a skin rash.  You have a fever.  You have redness around your IV site that gets worse. Get help right away if:  You have difficulty breathing.  You have chest pain.  You have blood in your urine or stool, or you vomit blood. Summary  After the procedure, it is common to have a sore throat or nausea. It is also common to feel tired.  Have a responsible adult stay with you for the first 24 hours after general anesthesia. It is important to have someone help care for you until you are awake and alert.  When you feel hungry, start by eating small amounts of foods that are soft  and easy to digest (bland), such as toast. Gradually return to your regular diet.  Drink enough fluid to keep your urine pale yellow.  Return to your normal activities as told by your health care provider. Ask your health care provider what activities are safe for you. This information is not intended to replace advice given to you by your health care provider. Make sure you discuss any questions you have with your health care provider. Document Revised: 12/17/2017 Document Reviewed: 07/30/2017 Elsevier Patient Education  [place].

## 2020-10-14 NOTE — ED Triage Notes (Signed)
Pt reports left flank pain since last THursday.  Diagnosed with kidney stone and is supposed to have a stent put in possible this Friday.

## 2020-10-14 NOTE — Anesthesia Preprocedure Evaluation (Signed)
Anesthesia Evaluation  Patient identified by MRN, date of birth, ID band Patient awake    Reviewed: Allergy & Precautions, NPO status , Patient's Chart, lab work & pertinent test results  History of Anesthesia Complications Negative for: history of anesthetic complications  Airway Mallampati: III  TM Distance: >3 FB Neck ROM: Full    Dental  (+) Dental Advisory Given, Caps   Pulmonary neg pulmonary ROS,    Pulmonary exam normal breath sounds clear to auscultation       Cardiovascular Exercise Tolerance: Good hypertension, Pt. on medications  Rhythm:Regular Rate:Normal     Neuro/Psych negative neurological ROS  negative psych ROS   GI/Hepatic GERD  Medicated and Controlled,  Endo/Other    Renal/GU Renal InsufficiencyRenal disease     Musculoskeletal negative musculoskeletal ROS (+)   Abdominal   Peds  Hematology   Anesthesia Other Findings   Reproductive/Obstetrics negative OB ROS                             Anesthesia Physical  Anesthesia Plan  ASA: II  Anesthesia Plan: General   Post-op Pain Management:    Induction: Intravenous  PONV Risk Score and Plan: 2 and Treatment may vary due to age or medical condition  Airway Management Planned:   Additional Equipment:   Intra-op Plan:   Post-operative Plan:   Informed Consent: I have reviewed the patients History and Physical, chart, labs and discussed the procedure including the risks, benefits and alternatives for the proposed anesthesia with the patient or authorized representative who has indicated his/her understanding and acceptance.     Dental advisory given  Plan Discussed with: CRNA and Surgeon  Anesthesia Plan Comments:         Anesthesia Quick Evaluation

## 2020-10-14 NOTE — Op Note (Signed)
.  Preoperative diagnosis: Left ureteral stone  Postoperative diagnosis: Same  Procedure: 1 cystoscopy 2. Left retrograde pyelography 3.  Intraoperative fluoroscopy, under one hour, with interpretation 4.  Left diagnostic ureteroscopy 5.  Left 6 x 26 JJ stent placement  Attending: Rosie Fate  Anesthesia: General  Estimated blood loss: None  Drains: Left 6 x 26 JJ ureteral stent without tether  Specimens: none  Antibiotics: ancef  Findings: left proximal ureteral stone. Moderate hydronephrosis. Ureter was narrow at the level of the iliac vessels and the ureteroscope was unable to be advanced past the iliac vessels. No masses/lesions in the bladder. Ureteral orifices in normal anatomic location.  Indications: Patient is a 59 year old male with a history of left ureteral stone and who has failed medical expulsive therapy.  After discussing treatment options, he decided proceed with left ureteroscopic stone manipulation.  Procedure her in detail: The patient was brought to the operating room and a brief timeout was done to ensure correct patient, correct procedure, correct site.  General anesthesia was administered patient was placed in dorsal lithotomy position.  Her genitalia was then prepped and draped in usual sterile fashion.  A rigid 7 French cystoscope was passed in the urethra and the bladder.  Bladder was inspected free masses or lesions.  the ureteral orifices were in the normal orthotopic locations.  a 6 french ureteral catheter was then instilled into the left ureteral orifice.  a gentle retrograde was obtained and findings noted above.  we then placed a zip wire through the ureteral catheter and advanced up to the renal pelvis.  we then removed the cystoscope and cannulated the left ureteral orifice with a semirigid ureteroscope. We advanced the scope up to the level of the iliac vessels but we were not able to advance the scope further due to narrowing of the ureter. We  elected to leave a stent.  We  placed a 6 x 26 double-j ureteral stent over the original zip wire. We then removed the wire and good coil was noted in the the renal pelvis under fluoroscopy and the bladder under direct vision.     the bladder was then drained and this concluded the procedure which was well tolerated by patient.  Complications: None  Condition: Stable, extubated, transferred to PACU  Plan: Patient is to be discharged home as to follow-up in 2 weeks for ureteroscopic stone extraction

## 2020-10-14 NOTE — Anesthesia Procedure Notes (Signed)
Procedure Name: LMA Insertion Date/Time: 10/14/2020 1:04 PM Performed by: Vista Deck, CRNA Pre-anesthesia Checklist: Patient identified, Patient being monitored, Emergency Drugs available, Timeout performed and Suction available Patient Re-evaluated:Patient Re-evaluated prior to induction Oxygen Delivery Method: Circle System Utilized Preoxygenation: Pre-oxygenation with 100% oxygen Induction Type: IV induction LMA: LMA inserted LMA Size: 4.0 Number of attempts: 1 Placement Confirmation: positive ETCO2 and breath sounds checked- equal and bilateral Tube secured with: Tape Dental Injury: Teeth and Oropharynx as per pre-operative assessment

## 2020-10-14 NOTE — Transfer of Care (Signed)
Immediate Anesthesia Transfer of Care Note  Patient: Travis Warner  Procedure(s) Performed: CYSTOSCOPY WITH RETROGRADE PYELOGRAM, DIAGNOSTIC URETEROSCOPY AND STENT PLACEMENT (Left Ureter)  Patient Location: PACU  Anesthesia Type:General  Level of Consciousness: awake, alert  and patient cooperative  Airway & Oxygen Therapy: Patient Spontanous Breathing  Post-op Assessment: Report given to RN and Post -op Vital signs reviewed and stable  Post vital signs: Reviewed and stable  Last Vitals:  Vitals Value Taken Time  BP 126/76 10/14/20 1339  Temp 98   Pulse 77 10/14/20 1340  Resp 14 10/14/20 1340  SpO2 96 % 10/14/20 1340  Vitals shown include unvalidated device data.  Last Pain:  Vitals:   10/14/20 1058  TempSrc: Oral  PainSc: 0-No pain         Complications: No complications documented.

## 2020-10-14 NOTE — ED Notes (Signed)
Pt is aware we need urine sample, urinal on bedside.

## 2020-10-14 NOTE — Interval H&P Note (Signed)
History and Physical Interval Note:  10/14/2020 12:01 PM  Travis Warner  has presented today for surgery, with the diagnosis of let ureteral calculus.  The various methods of treatment have been discussed with the patient and family. After consideration of risks, benefits and other options for treatment, the patient has consented to  Procedure(s): CYSTOSCOPY WITH RETROGRADE PYELOGRAM, URETEROSCOPY AND STENT PLACEMENT (Left) HOLMIUM LASER APPLICATION (Left) as a surgical intervention.  The patient's history has been reviewed, patient examined, no change in status, stable for surgery.  I have reviewed the patient's chart and labs.  Questions were answered to the patient's satisfaction.     Nicolette Bang

## 2020-10-14 NOTE — Anesthesia Postprocedure Evaluation (Signed)
Anesthesia Post Note  Patient: Travis Warner  Procedure(s) Performed: CYSTOSCOPY WITH RETROGRADE PYELOGRAM, DIAGNOSTIC URETEROSCOPY AND STENT PLACEMENT (Left Ureter)  Patient location during evaluation: PACU Anesthesia Type: General Level of consciousness: awake and alert and patient cooperative Pain management: satisfactory to patient Vital Signs Assessment: post-procedure vital signs reviewed and stable Respiratory status: spontaneous breathing Cardiovascular status: stable Postop Assessment: no apparent nausea or vomiting and adequate PO intake Anesthetic complications: no   No complications documented.   Last Vitals:  Vitals:   10/14/20 1340 10/14/20 1345  BP: 126/76 136/75  Pulse: 77 66  Resp: 12 11  Temp: 36.7 C   SpO2: 96% 96%    Last Pain:  Vitals:   10/14/20 1340  TempSrc:   PainSc: 0-No pain                 Rosemarie Galvis

## 2020-10-14 NOTE — ED Provider Notes (Signed)
[facility] EMERGENCY DEPARTMENT Provider Note   CSN: [id number] Arrival date & time: 10/14/20  3007     History Chief Complaint  Patient presents with  . Flank Pain    Travis Warner is a 59 y.o. male.  [name] is a 59 year old gentleman who presents to the ED with 5 days of left-sided flank pain.  Is a previous medical history significant for chronic kidney stones.  He notes that he first started experiencing left flank pain about Wednesday of this past week and was seen by urology on Thursday.  Urology, a renal ultrasound was performed that demonstrated roughly a 7 mm kidney stone.  Several procedural options were discussed that urology and he was planning for a procedure later this month.  He was ultimately sent home from urology with Flomax and Percocet.  These medications were particularly helpful for couple of days but are no longer helping with his pain.  In the past day, his pain is significantly worsened and he can no longer tolerate the pain with his outpatient medications.  He is coming to the ED today to see if anything more can be done or if this procedure can be moved forward.  He specifically denies fever, shortness of breath, nausea, vomiting, chills, dysuria.        Past Medical History:  Diagnosis Date  . Cancer ([place])    skin cancer- basal cell on face  . Chronic kidney disease    kidney stone  . Hypercholesterolemia   . Hypertension   . Insomnia     Patient Active Problem List   Diagnosis Date Noted  . Kidney stones 10/10/2020  . History of colonic polyps 02/05/2020  . Constipation 04/17/2019  . Abdominal pain 04/17/2019  . GERD (gastroesophageal reflux disease) 04/17/2019  . FOOT PAIN, RIGHT 03/07/2009    Past Surgical History:  Procedure Laterality Date  . COLONOSCOPY  02/24/2012   Procedure: COLONOSCOPY;  Surgeon: Daneil Dolin, MD;  Location: [facility];  Service: Endoscopy;  Laterality: N/A;  11:30 AM  . COLONOSCOPY N/A 04/22/2017    Procedure: COLONOSCOPY;  Surgeon: Daneil Dolin, MD;  2 sessile serrated polyp/adenomas with high grade dysplasia. Due for repeat in 2021.   Marland Kitchen COLONOSCOPY WITH PROPOFOL N/A 04/01/2020   Procedure: COLONOSCOPY WITH PROPOFOL;  Surgeon: Daneil Dolin, MD;  Location: [facility];  Service: Endoscopy;  Laterality: N/A;  2:00pm - pt knows to arrive at 8:30  . POLYPECTOMY  04/22/2017   Procedure: POLYPECTOMY;  Surgeon: Daneil Dolin, MD;  Location: [facility];  Service: Endoscopy;;  colon  . POLYPECTOMY  04/01/2020   Procedure: POLYPECTOMY;  Surgeon: Daneil Dolin, MD;  Location: [facility];  Service: Endoscopy;;  . Skin cancer removed from face         Family History  Problem Relation Age of Onset  . COPD Mother   . Liver cancer Father   . Colon cancer Neg Hx     Social History   Tobacco Use  . Smoking status: Never Smoker  . Smokeless tobacco: Never Used  Vaping Use  . Vaping Use: Never used  Substance Use Topics  . Alcohol use: No  . Drug use: No    Home Medications Prior to Admission medications   Medication Sig Start Date End Date Taking? Authorizing Provider  ALPRAZolam Duanne Moron) 1 MG tablet Take 1 mg by mouth daily as needed for anxiety.  02/17/19   [provider]  Multiple Vitamin (  MULTIVITAMIN WITH MINERALS) TABS tablet Take 2 tablets by mouth daily.    [provider]  olmesartan (BENICAR) 20 MG tablet Take 20 mg by mouth daily. 03/08/20   [provider]  ondansetron (ZOFRAN ODT) 4 MG disintegrating tablet Take 1 tablet (4 mg total) by mouth every 8 (eight) hours as needed for nausea or vomiting. 10/10/20   McKenzie, Candee Furbish, MD  oxyCODONE-acetaminophen (PERCOCET) 5-325 MG tablet Take 1 tablet by mouth every 4 (four) hours as needed. 10/10/20   McKenzie, Candee Furbish, MD  polyethylene glycol-electrolytes (TRILYTE) 420 g solution Take 4,000 mLs by mouth as directed. 02/05/20   Rourk, Cristopher Estimable, MD  rosuvastatin (CRESTOR) 20 MG tablet Take 20 mg  by mouth at bedtime. 03/01/20   [provider]  tamsulosin (FLOMAX) 0.4 MG CAPS capsule Take 1 capsule (0.4 mg total) by mouth daily. 10/10/20   McKenzie, Candee Furbish, MD  zolpidem (AMBIEN) 10 MG tablet Take 10 mg by mouth at bedtime as needed for sleep.  02/17/19   [provider]    Allergies    Patient has no known allergies.  Review of Systems   Review of Systems  Constitutional: Negative for chills, fatigue and fever.  Respiratory: Negative for shortness of breath.   Cardiovascular: Negative for chest pain.  Gastrointestinal: Positive for abdominal pain. Negative for abdominal distention, diarrhea, nausea and vomiting.  Genitourinary: Negative for dysuria.  Skin: Negative for rash.    Physical Exam Updated Vital Signs BP (!) 171/85 (BP Location: Right Arm)   Pulse 71   Temp 98.5 F (36.9 C) (Oral)   Resp 18   Ht 5\' 9"  (1.753 m)   Wt 72 kg   SpO2 100%   BMI 23.44 kg/m   Physical Exam Constitutional:      General: He is not in acute distress.    Appearance: Normal appearance. He is normal weight. He is not ill-appearing or toxic-appearing.     Comments: [name] was standing upon entering the room because it was more comfortable and lying down on his back which would exacerbate his pain.  HENT:     Head: Normocephalic and atraumatic.     Right Ear: External ear normal.     Left Ear: External ear normal.     Nose: Nose normal.  Eyes:     Conjunctiva/sclera: Conjunctivae normal.     Pupils: Pupils are equal, round, and reactive to light.  Cardiovascular:     Rate and Rhythm: Normal rate and regular rhythm.     Pulses: Normal pulses.     Heart sounds: Normal heart sounds.  Pulmonary:     Effort: Pulmonary effort is normal. No respiratory distress.     Breath sounds: Normal breath sounds. No wheezing or rales.  Abdominal:     General: Abdomen is flat. Bowel sounds are normal.     Palpations: Abdomen is soft.     Tenderness: There is abdominal  tenderness (left flank tenderness). There is right CVA tenderness.  Musculoskeletal:        General: Normal range of motion.  Skin:    General: Skin is warm and dry.  Neurological:     General: No focal deficit present.     Mental Status: He is alert.     Cranial Nerves: No cranial nerve deficit.     Motor: No weakness.  Psychiatric:        Mood and Affect: Mood normal.     ED Results / Procedures /  Treatments   Labs (all labs ordered are listed, but only abnormal results are displayed) Labs Reviewed  RESPIRATORY PANEL BY RT PCR (FLU A&B, COVID)  URINE CULTURE  URINALYSIS, ROUTINE W REFLEX MICROSCOPIC    EKG None  Radiology No results found.  Procedures Procedures (including critical care time)  Medications Ordered in ED Medications  ketorolac (TORADOL) 30 MG/ML injection 30 mg (has no administration in time range)    ED Course  I have reviewed the triage vital signs and the nursing notes.  Pertinent labs & imaging results that were available during my care of the patient were reviewed by me and considered in my medical decision making (see chart for details).    MDM Rules/Calculators/A&P                          [name] is a 59 year old gentleman who presents to the ED with 5 days of left-sided flank pain.  Is a previous medical history significant for chronic kidney stones.  Since he was seen by urology last week, it seems that his pain is significantly worsened.  Low suspicion for urinary tract infection at this time based on lack of fever, nausea, vomiting, dysuria.  Based on his current presentation, this appears to be uncomplicated kidney stone.  While assessing [name], Dr. Alyson Ingles also came into the room and reported that there was room to bring [name] to surgery today.  He is currently planning on bringing him to the OR at around 1 PM.  Dr. Alyson Ingles noted that if there is no concern for infection, no significant work-up is necessary.  We will  move forward with UA and urine culture and medications for pain and nausea.   Final Clinical Impression(s) / ED Diagnoses Final diagnoses:  None    Rx / DC Orders ED Discharge Orders    None       Matilde Haymaker, MD 10/14/20 [id number]    Elnora Morrison, MD 10/14/20 [phone]

## 2020-10-15 ENCOUNTER — Encounter (HOSPITAL_COMMUNITY): Payer: Self-pay | Admitting: Urology

## 2020-10-15 LAB — URINE CULTURE: Culture: NO GROWTH

## 2020-10-15 MED ORDER — WATER FOR IRRIGATION, STERILE IR SOLN
Status: DC | PRN
  Administered 2020-10-14: 1000 mL

## 2020-10-15 NOTE — Telephone Encounter (Signed)
Pt went to ER

## 2020-10-16 ENCOUNTER — Encounter (HOSPITAL_COMMUNITY): Admission: RE | Admit: 2020-10-16 | Payer: 59 | Source: Ambulatory Visit

## 2020-10-16 ENCOUNTER — Other Ambulatory Visit (HOSPITAL_COMMUNITY): Payer: 59

## 2020-10-24 ENCOUNTER — Ambulatory Visit: Payer: 59 | Admitting: Urology

## 2020-10-28 NOTE — Patient Instructions (Signed)
Travis Warner  10/28/2020     @PREFPERIOPPHARMACY @   Your procedure is scheduled on  10/31/2020  Report to [facility] at  1100  A.M.  Call this number if you have problems the morning of surgery:  [phone]   Remember:  Do not eat or drink after midnight.                         Take these medicines the morning of surgery with A SIP OF WATER Xanax (if needed), zofran(if needed), oxycodone(if needed).    Do not wear jewelry, make-up or nail polish.  Do not wear lotions, powders, or perfumes. Please wear deodorant and brush your teeth.  Do not shave 48 hours prior to surgery.  Men may shave face and neck.  Do not bring valuables to the hospital.  [facility] is not responsible for any belongings or valuables.  Contacts, dentures or bridgework may not be worn into surgery.  Leave your suitcase in the car.  After surgery it may be brought to your room.  For patients admitted to the hospital, discharge time will be determined by your treatment team.  Patients discharged the day of surgery will not be allowed to drive home.   Name and phone number of your driver:   family Special instructions:   DO NOT smoke the morning of your procedure.  Please read over the following fact sheets that you were given. Anesthesia Post-op Instructions and Care and Recovery After Surgery       Ureteral Stent Implantation, Care After This sheet gives you information about how to care for yourself after your procedure. Your health care provider may also give you more specific instructions. If you have problems or questions, contact your health care provider. What can I expect after the procedure? After the procedure, it is common to have:  Nausea.  Mild pain when you urinate. You may feel this pain in your lower back or lower abdomen. The pain should stop within a few minutes after you urinate. This may last for up to 1 week.  A small amount of blood in your urine for  several days. Follow these instructions at home: Medicines  Take over-the-counter and prescription medicines only as told by your health care provider.  If you were prescribed an antibiotic medicine, take it as told by your health care provider. Do not stop taking the antibiotic even if you start to feel better.  Do not drive for 24 hours if you were given a sedative during your procedure.  Ask your health care provider if the medicine prescribed to you requires you to avoid driving or using heavy machinery. Activity  Rest as told by your health care provider.  Avoid sitting for a long time without moving. Get up to take short walks every 1-2 hours. This is important to improve blood flow and breathing. Ask for help if you feel weak or unsteady.  Return to your normal activities as told by your health care provider. Ask your health care provider what activities are safe for you. General instructions   Watch for any blood in your urine. Call your health care provider if the amount of blood in your urine increases.  If you have a catheter: ? Follow instructions from your health care provider about taking care of your catheter and collection bag. ? Do not take baths, swim, or use a hot  tub until your health care provider approves. Ask your health care provider if you may take showers. You may only be allowed to take sponge baths.  Drink enough fluid to keep your urine pale yellow.  Do not use any products that contain nicotine or tobacco, such as cigarettes, e-cigarettes, and chewing tobacco. These can delay healing after surgery. If you need help quitting, ask your health care provider.  Keep all follow-up visits as told by your health care provider. This is important. Contact a health care provider if:  You have pain that gets worse or does not get better with medicine, especially pain when you urinate.  You have difficulty urinating.  You feel nauseous or you vomit repeatedly  during a period of more than 2 days after the procedure. Get help right away if:  Your urine is dark red or has blood clots in it.  You are leaking urine (have incontinence).  The end of the stent comes out of your urethra.  You cannot urinate.  You have sudden, sharp, or severe pain in your abdomen or lower back.  You have a fever.  You have swelling or pain in your legs.  You have difficulty breathing. Summary  After the procedure, it is common to have mild pain when you urinate that goes away within a few minutes after you urinate. This may last for up to 1 week.  Watch for any blood in your urine. Call your health care provider if the amount of blood in your urine increases.  Take over-the-counter and prescription medicines only as told by your health care provider.  Drink enough fluid to keep your urine pale yellow. This information is not intended to replace advice given to you by your health care provider. Make sure you discuss any questions you have with your health care provider. Document Revised: 09/20/2018 Document Reviewed: 09/21/2018 Elsevier Patient Education  2020 [employer].  Cystoscopy Cystoscopy is a procedure that is used to help diagnose and sometimes treat conditions that affect the lower urinary tract. The lower urinary tract includes the bladder and the urethra. The urethra is the tube that drains urine from the bladder. Cystoscopy is done using a thin, tube-shaped instrument with a light and camera at the end (cystoscope). The cystoscope may be hard or flexible, depending on the goal of the procedure. The cystoscope is inserted through the urethra, into the bladder. Cystoscopy may be recommended if you have:  Urinary tract infections that keep coming back.  Blood in the urine (hematuria).  An inability to control when you urinate (urinary incontinence) or an overactive bladder.  Unusual cells found in a urine sample.  A blockage in the urethra,  such as a urinary stone.  Painful urination.  An abnormality in the bladder found during an intravenous pyelogram (IVP) or CT scan. Cystoscopy may also be done to remove a sample of tissue to be examined under a microscope (biopsy). Tell a health care provider about:  Any allergies you have.  All medicines you are taking, including vitamins, herbs, eye drops, creams, and over-the-counter medicines.  Any problems you or family members have had with anesthetic medicines.  Any blood disorders you have.  Any surgeries you have had.  Any medical conditions you have.  Whether you are pregnant or may be pregnant. What are the risks? Generally, this is a safe procedure. However, problems may occur, including:  Infection.  Bleeding.  Allergic reactions to medicines.  Damage to other structures or organs. What  happens before the procedure?  Ask your health care provider about: ? Changing or stopping your regular medicines. This is especially important if you are taking diabetes medicines or blood thinners. ? Taking medicines such as aspirin and ibuprofen. These medicines can thin your blood. Do not take these medicines unless your health care provider tells you to take them. ? Taking over-the-counter medicines, vitamins, herbs, and supplements.  Follow instructions from your health care provider about eating or drinking restrictions.  Ask your health care provider what steps will be taken to help prevent infection. These may include: ? Washing skin with a germ-killing soap. ? Taking antibiotic medicine.  You may have an exam or testing, such as: ? X-rays of the bladder, urethra, or kidneys. ? Urine tests to check for signs of infection.  Plan to have someone take you home from the hospital or clinic. What happens during the procedure?   You will be given one or more of the following: ? A medicine to help you relax (sedative). ? A medicine to numb the area (local  anesthetic).  The area around the opening of your urethra will be cleaned.  The cystoscope will be passed through your urethra into your bladder.  Germ-free (sterile) fluid will flow through the cystoscope to fill your bladder. The fluid will stretch your bladder so that your health care provider can clearly examine your bladder walls.  Your doctor will look at the urethra and bladder. Your doctor may take a biopsy or remove stones.  The cystoscope will be removed, and your bladder will be emptied. The procedure may vary among health care providers and hospitals. What can I expect after the procedure? After the procedure, it is common to have:  Some soreness or pain in your abdomen and urethra.  Urinary symptoms. These include: ? Mild pain or burning when you urinate. Pain should stop within a few minutes after you urinate. This may last for up to 1 week. ? A small amount of blood in your urine for several days. ? Feeling like you need to urinate but producing only a small amount of urine. Follow these instructions at home: Medicines  Take over-the-counter and prescription medicines only as told by your health care provider.  If you were prescribed an antibiotic medicine, take it as told by your health care provider. Do not stop taking the antibiotic even if you start to feel better. General instructions  Return to your normal activities as told by your health care provider. Ask your health care provider what activities are safe for you.  Do not drive for 24 hours if you were given a sedative during your procedure.  Watch for any blood in your urine. If the amount of blood in your urine increases, call your health care provider.  Follow instructions from your health care provider about eating or drinking restrictions.  If a tissue sample was removed for testing (biopsy) during your procedure, it is up to you to get your test results. Ask your health care provider, or the  department that is doing the test, when your results will be ready.  Drink enough fluid to keep your urine pale yellow.  Keep all follow-up visits as told by your health care provider. This is important. Contact a health care provider if you:  Have pain that gets worse or does not get better with medicine, especially pain when you urinate.  Have trouble urinating.  Have more blood in your urine. Get help right away if  you:  Have blood clots in your urine.  Have abdominal pain.  Have a fever or chills.  Are unable to urinate. Summary  Cystoscopy is a procedure that is used to help diagnose and sometimes treat conditions that affect the lower urinary tract.  Cystoscopy is done using a thin, tube-shaped instrument with a light and camera at the end.  After the procedure, it is common to have some soreness or pain in your abdomen and urethra.  Watch for any blood in your urine. If the amount of blood in your urine increases, call your health care provider.  If you were prescribed an antibiotic medicine, take it as told by your health care provider. Do not stop taking the antibiotic even if you start to feel better. This information is not intended to replace advice given to you by your health care provider. Make sure you discuss any questions you have with your health care provider. Document Revised: 12/06/2018 Document Reviewed: 12/06/2018 Elsevier Patient Education  [place].  Ureteroscopy Ureteroscopy is a procedure to check for and treat problems inside part of the urinary tract. In this procedure, a thin, tube-shaped instrument with a light at the end (ureteroscope) is used to look at the inside of the kidneys and the ureters, which are the tubes that carry urine from the kidneys to the bladder. The ureteroscope is inserted into one or both of the ureters. You may need this procedure if you have frequent urinary tract infections (UTIs), blood in your urine, or a stone  in one of your ureters. A ureteroscopy can be done to find the cause of urine blockage in a ureter and to evaluate other abnormalities inside the ureters or kidneys. If stones are found, they can be removed during the procedure. Polyps, abnormal tissue, and some types of tumors can also be removed or treated. The ureteroscope may also have a tool to remove tissue to be checked for disease under a microscope (biopsy). Tell a health care provider about:  Any allergies you have.  All medicines you are taking, including vitamins, herbs, eye drops, creams, and over-the-counter medicines.  Any problems you or family members have had with anesthetic medicines.  Any blood disorders you have.  Any surgeries you have had.  Any medical conditions you have.  Whether you are pregnant or may be pregnant. What are the risks? Generally, this is a safe procedure. However, problems may occur, including:  Bleeding.  Infection.  Allergic reactions to medicines.  Scarring that narrows the ureter (stricture).  Creating a hole in the ureter (perforation). What happens before the procedure? Staying hydrated Follow instructions from your health care provider about hydration, which may include:  Up to 2 hours before the procedure - you may continue to drink clear liquids, such as water, clear fruit juice, black coffee, and plain tea. Eating and drinking restrictions Follow instructions from your health care provider about eating and drinking, which may include:  8 hours before the procedure - stop eating heavy meals or foods such as meat, fried foods, or fatty foods.  6 hours before the procedure - stop eating light meals or foods, such as toast or cereal.  6 hours before the procedure - stop drinking milk or drinks that contain milk.  2 hours before the procedure - stop drinking clear liquids. Medicines  Ask your health care provider about: ? Changing or stopping your regular medicines. This is  especially important if you are taking diabetes medicines or blood thinners. ?  Taking medicines such as aspirin and ibuprofen. These medicines can thin your blood. Do not take these medicines before your procedure if your health care provider instructs you not to.  You may be given antibiotic medicine to help prevent infection. General instructions  You may have a urine sample taken to check for infection.  Plan to have someone take you home from the hospital or clinic. What happens during the procedure?   To reduce your risk of infection: ? Your health care team will wash or sanitize their hands. ? Your skin will be washed with soap.  An IV tube will be inserted into one of your veins.  You will be given one of the following: ? A medicine to help you relax (sedative). ? A medicine to make you fall asleep (general anesthetic). ? A medicine that is injected into your spine to numb the area below and slightly above the injection site (spinal anesthetic).  To lower your risk of infection, you may be given an antibiotic medicine by an injection or through the IV tube.  The opening from which you urinate (urethra) will be cleaned with a germ-killing solution.  The ureteroscope will be passed through your urethra into your bladder.  A salt-water solution will flow through the ureteroscope to fill your bladder. This will help the health care provider see the openings of your ureters more clearly.  Then, the ureteroscope will be passed into your ureter. ? If a growth is found, a piece of it may be removed so it can be examined under a microscope (biopsy). ? If a stone is found, it may be removed through the ureteroscope, or the stone may be broken up using a laser, shock waves, or electrical energy. ? In some cases, if the ureter is too small, a tube may be inserted that keeps the ureter open (ureteral stent). The stent may be left in place for 1 or 2 weeks to keep the ureter open, and then  the ureteroscopy procedure will be performed.  The scope will be removed, and your bladder will be emptied. The procedure may vary among health care providers and hospitals. What happens after the procedure?  Your blood pressure, heart rate, breathing rate, and blood oxygen level will be monitored until the medicines you were given have worn off.  You may be asked to urinate.  Donot drive for 24 hours if you were given a sedative. This information is not intended to replace advice given to you by your health care provider. Make sure you discuss any questions you have with your health care provider. Document Revised: 11/26/2017 Document Reviewed: 09/25/2016 Elsevier Patient Education  2020 [place] After These instructions provide you with information about caring for yourself after your procedure. Your health care provider may also give you more specific instructions. Your treatment has been planned according to current medical practices, but problems sometimes occur. Call your health care provider if you have any problems or questions after your procedure. What can I expect after the procedure? After your procedure, you may:  Feel sleepy for several hours.  Feel clumsy and have poor balance for several hours.  Feel forgetful about what happened after the procedure.  Have poor judgment for several hours.  Feel nauseous or vomit.  Have a sore throat if you had a breathing tube during the procedure. Follow these instructions at home: For at least 24 hours after the procedure:      Have a responsible  adult stay with you. It is important to have someone help care for you until you are awake and alert.  Rest as needed.  Do not: ? Participate in activities in which you could fall or become injured. ? Drive. ? Use heavy machinery. ? Drink alcohol. ? Take sleeping pills or medicines that cause drowsiness. ? Make important decisions or sign  legal documents. ? Take care of children on your own. Eating and drinking  Follow the diet that is recommended by your health care provider.  If you vomit, drink water, juice, or soup when you can drink without vomiting.  Make sure you have little or no nausea before eating solid foods. General instructions  Take over-the-counter and prescription medicines only as told by your health care provider.  If you have sleep apnea, surgery and certain medicines can increase your risk for breathing problems. Follow instructions from your health care provider about wearing your sleep device: ? Anytime you are sleeping, including during daytime naps. ? While taking prescription pain medicines, sleeping medicines, or medicines that make you drowsy.  If you smoke, do not smoke without supervision.  Keep all follow-up visits as told by your health care provider. This is important. Contact a health care provider if:  You keep feeling nauseous or you keep vomiting.  You feel light-headed.  You develop a rash.  You have a fever. Get help right away if:  You have trouble breathing. Summary  For several hours after your procedure, you may feel sleepy and have poor judgment.  Have a responsible adult stay with you for at least 24 hours or until you are awake and alert. This information is not intended to replace advice given to you by your health care provider. Make sure you discuss any questions you have with your health care provider. Document Revised: 03/14/2018 Document Reviewed: 04/05/2016 Elsevier Patient Education  [place].

## 2020-10-29 ENCOUNTER — Other Ambulatory Visit (HOSPITAL_COMMUNITY)
Admission: RE | Admit: 2020-10-29 | Discharge: 2020-10-29 | Disposition: A | Discharge status: Home / Self Care | Payer: 59 | Source: Ambulatory Visit | Attending: Urology | Admitting: Urology

## 2020-10-29 ENCOUNTER — Other Ambulatory Visit: Payer: Self-pay

## 2020-10-29 ENCOUNTER — Encounter (HOSPITAL_COMMUNITY): Payer: Self-pay

## 2020-10-29 ENCOUNTER — Encounter (HOSPITAL_COMMUNITY)
Admission: RE | Admit: 2020-10-29 | Discharge: 2020-10-29 | Disposition: A | Discharge status: Home / Self Care | Payer: 59 | Source: Ambulatory Visit | Attending: Urology | Admitting: Urology

## 2020-10-29 DIAGNOSIS — Z20822 Contact with and (suspected) exposure to covid-19: Secondary | ICD-10-CM | POA: Insufficient documentation

## 2020-10-29 DIAGNOSIS — Z01812 Encounter for preprocedural laboratory examination: Secondary | ICD-10-CM | POA: Insufficient documentation

## 2020-10-30 LAB — SARS CORONAVIRUS 2 (TAT 6-24 HRS): SARS Coronavirus 2: NEGATIVE

## 2020-10-31 ENCOUNTER — Encounter (HOSPITAL_COMMUNITY): Payer: Self-pay | Admitting: Urology

## 2020-10-31 ENCOUNTER — Ambulatory Visit (HOSPITAL_COMMUNITY)
Admission: RE | Admit: 2020-10-31 | Discharge: 2020-10-31 | Disposition: A | Discharge status: Home / Self Care | Payer: 59 | Attending: Urology | Admitting: Urology

## 2020-10-31 ENCOUNTER — Ambulatory Visit (HOSPITAL_COMMUNITY): Payer: 59

## 2020-10-31 ENCOUNTER — Ambulatory Visit (HOSPITAL_COMMUNITY): Payer: 59 | Admitting: Certified Registered"

## 2020-10-31 ENCOUNTER — Encounter (HOSPITAL_COMMUNITY)
Admission: RE | Disposition: A | Discharge status: Home / Self Care | Payer: Self-pay | Source: Home / Self Care | Attending: Urology

## 2020-10-31 DIAGNOSIS — N201 Calculus of ureter: Secondary | ICD-10-CM | POA: Insufficient documentation

## 2020-10-31 DIAGNOSIS — Z825 Family history of asthma and other chronic lower respiratory diseases: Secondary | ICD-10-CM | POA: Insufficient documentation

## 2020-10-31 DIAGNOSIS — Z87442 Personal history of urinary calculi: Secondary | ICD-10-CM | POA: Diagnosis not present

## 2020-10-31 DIAGNOSIS — Z800 Family history of malignant neoplasm of digestive organs: Secondary | ICD-10-CM | POA: Insufficient documentation

## 2020-10-31 DIAGNOSIS — N200 Calculus of kidney: Secondary | ICD-10-CM

## 2020-10-31 DIAGNOSIS — Z79899 Other long term (current) drug therapy: Secondary | ICD-10-CM | POA: Diagnosis not present

## 2020-10-31 HISTORY — PX: HOLMIUM LASER APPLICATION: SHX5852

## 2020-10-31 HISTORY — PX: CYSTOSCOPY WITH RETROGRADE PYELOGRAM, URETEROSCOPY AND STENT PLACEMENT: SHX5789

## 2020-10-31 SURGERY — CYSTOURETEROSCOPY, WITH RETROGRADE PYELOGRAM AND STENT INSERTION
Anesthesia: General | Laterality: Left

## 2020-10-31 MED ORDER — ONDANSETRON HCL 4 MG/2ML IJ SOLN
INTRAMUSCULAR | Status: AC
  Filled 2020-10-31: qty 2

## 2020-10-31 MED ORDER — MIDAZOLAM HCL 5 MG/5ML IJ SOLN
INTRAMUSCULAR | Status: DC | PRN
  Administered 2020-10-31: 2 mg via INTRAVENOUS

## 2020-10-31 MED ORDER — ONDANSETRON HCL 4 MG/2ML IJ SOLN
INTRAMUSCULAR | Status: DC | PRN
  Administered 2020-10-31: 4 mg via INTRAVENOUS

## 2020-10-31 MED ORDER — WATER FOR IRRIGATION, STERILE IR SOLN
Status: DC | PRN
  Administered 2020-10-31: 500 mL

## 2020-10-31 MED ORDER — CEFAZOLIN SODIUM-DEXTROSE 2-4 GM/100ML-% IV SOLN
2.0000 g | INTRAVENOUS | Status: AC
  Administered 2020-10-31: 2 g via INTRAVENOUS

## 2020-10-31 MED ORDER — TAMSULOSIN HCL 0.4 MG PO CAPS: 0.4000 mg | ORAL_CAPSULE | Freq: Every day | ORAL | 1 refills | Status: DC

## 2020-10-31 MED ORDER — CEFAZOLIN SODIUM-DEXTROSE 2-4 GM/100ML-% IV SOLN
INTRAVENOUS | Status: AC
  Filled 2020-10-31: qty 100

## 2020-10-31 MED ORDER — DEXAMETHASONE SODIUM PHOSPHATE 4 MG/ML IJ SOLN
INTRAMUSCULAR | Status: DC | PRN
  Administered 2020-10-31: 4 mg via INTRAVENOUS

## 2020-10-31 MED ORDER — SODIUM CHLORIDE 0.9 % IR SOLN
Status: DC | PRN
  Administered 2020-10-31: 3000 mL via INTRAVESICAL

## 2020-10-31 MED ORDER — DEXAMETHASONE SODIUM PHOSPHATE 10 MG/ML IJ SOLN
INTRAMUSCULAR | Status: AC
  Filled 2020-10-31: qty 1

## 2020-10-31 MED ORDER — EPHEDRINE 5 MG/ML INJ
INTRAVENOUS | Status: AC
  Filled 2020-10-31: qty 10

## 2020-10-31 MED ORDER — ONDANSETRON 4 MG PO TBDP: 4.0000 mg | ORAL_TABLET | Freq: Three times a day (TID) | ORAL | 0 refills | Status: DC | PRN

## 2020-10-31 MED ORDER — OXYCODONE-ACETAMINOPHEN 5-325 MG PO TABS: 1.0000 | ORAL_TABLET | ORAL | 0 refills | Status: DC | PRN

## 2020-10-31 MED ORDER — ORAL CARE MOUTH RINSE: 15.0000 mL | Freq: Once | OROMUCOSAL | Status: AC

## 2020-10-31 MED ORDER — MIDAZOLAM HCL 2 MG/2ML IJ SOLN
INTRAMUSCULAR | Status: AC
  Filled 2020-10-31: qty 2

## 2020-10-31 MED ORDER — CHLORHEXIDINE GLUCONATE 0.12 % MT SOLN
15.0000 mL | Freq: Once | OROMUCOSAL | Status: AC
  Administered 2020-10-31: 15 mL via OROMUCOSAL

## 2020-10-31 MED ORDER — DIATRIZOATE MEGLUMINE 30 % UR SOLN
URETHRAL | Status: DC | PRN
  Administered 2020-10-31: 10 mL via URETHRAL

## 2020-10-31 MED ORDER — PROPOFOL 10 MG/ML IV BOLUS
INTRAVENOUS | Status: AC
  Filled 2020-10-31: qty 20

## 2020-10-31 MED ORDER — FENTANYL CITRATE (PF) 100 MCG/2ML IJ SOLN
INTRAMUSCULAR | Status: DC | PRN
  Administered 2020-10-31 (×4): 25 ug via INTRAVENOUS

## 2020-10-31 MED ORDER — FENTANYL CITRATE (PF) 100 MCG/2ML IJ SOLN
INTRAMUSCULAR | Status: AC
  Filled 2020-10-31: qty 2

## 2020-10-31 MED ORDER — EPHEDRINE SULFATE 50 MG/ML IJ SOLN
INTRAMUSCULAR | Status: DC | PRN
  Administered 2020-10-31: 5 mg via INTRAVENOUS

## 2020-10-31 MED ORDER — PROPOFOL 10 MG/ML IV BOLUS
INTRAVENOUS | Status: DC | PRN
  Administered 2020-10-31: 50 mg via INTRAVENOUS
  Administered 2020-10-31: 200 mg via INTRAVENOUS

## 2020-10-31 MED ORDER — LACTATED RINGERS IV SOLN: INTRAVENOUS | Status: DC

## 2020-10-31 MED ORDER — DIATRIZOATE MEGLUMINE 30 % UR SOLN
URETHRAL | Status: AC
  Filled 2020-10-31: qty 100

## 2020-10-31 SURGICAL SUPPLY — 26 items
BAG DRAIN URO TABLE W/ADPT NS (BAG) ×2 IMPLANT
BAG DRN 8 ADPR NS SKTRN CSTL (BAG) ×1
BAG HAMPER (MISCELLANEOUS) ×2 IMPLANT
CATH INTERMIT  6FR 70CM (CATHETERS) ×2 IMPLANT
CLOTH BEACON ORANGE TIMEOUT ST (SAFETY) ×2 IMPLANT
DECANTER SPIKE VIAL GLASS SM (MISCELLANEOUS) ×2 IMPLANT
EXTRACTOR STONE NITINOL NGAGE (UROLOGICAL SUPPLIES) ×1 IMPLANT
FIBER LASER FLEXIVA 200 (UROLOGICAL SUPPLIES) ×1 IMPLANT
GLOVE BIO SURGEON STRL SZ8 (GLOVE) ×2 IMPLANT
GLOVE BIOGEL PI IND STRL 7.0 (GLOVE) ×3 IMPLANT
GLOVE BIOGEL PI INDICATOR 7.0 (GLOVE) ×3
GOWN STRL REUS W/TWL LRG LVL3 (GOWN DISPOSABLE) ×2 IMPLANT
GOWN STRL REUS W/TWL XL LVL3 (GOWN DISPOSABLE) ×2 IMPLANT
GUIDEWIRE STR DUAL SENSOR (WIRE) ×2 IMPLANT
GUIDEWIRE STR ZIPWIRE 035X150 (MISCELLANEOUS) ×2 IMPLANT
IV NS IRRIG 3000ML ARTHROMATIC (IV SOLUTION) ×4 IMPLANT
KIT TURNOVER CYSTO (KITS) ×2 IMPLANT
MANIFOLD NEPTUNE II (INSTRUMENTS) ×2 IMPLANT
PACK CYSTO (CUSTOM PROCEDURE TRAY) ×2 IMPLANT
PAD ARMBOARD 7.5X6 YLW CONV (MISCELLANEOUS) ×2 IMPLANT
STENT URET 6FRX26 CONTOUR (STENTS) ×2 IMPLANT
SYR 10ML LL (SYRINGE) ×2 IMPLANT
TOWEL OR 17X26 4PK STRL BLUE (TOWEL DISPOSABLE) ×2 IMPLANT
TRACTIP FLEXIVA PULS ID 200XHI (Laser) IMPLANT
TRACTIP FLEXIVA PULSE ID 200 (Laser) ×2
WATER STERILE IRR 500ML POUR (IV SOLUTION) ×2 IMPLANT

## 2020-10-31 NOTE — Anesthesia Procedure Notes (Signed)
Procedure Name: LMA Insertion Performed by: Tacy Learn, CRNA Pre-anesthesia Checklist: Patient identified, Emergency Drugs available, Suction available, Patient being monitored and Timeout performed Patient Re-evaluated:Patient Re-evaluated prior to induction Oxygen Delivery Method: Circle system utilized Preoxygenation: Pre-oxygenation with 100% oxygen Induction Type: IV induction LMA: LMA inserted LMA Size: 5.0 Number of attempts: 1 Placement Confirmation: CO2 detector,  breath sounds checked- equal and bilateral and positive ETCO2 Tube secured with: Tape Dental Injury: Teeth and Oropharynx as per pre-operative assessment

## 2020-10-31 NOTE — Anesthesia Preprocedure Evaluation (Signed)
Anesthesia Evaluation  Patient identified by MRN, date of birth, ID band Patient awake    Reviewed: Allergy & Precautions, NPO status , Patient's Chart, lab work & pertinent test results  History of Anesthesia Complications Negative for: history of anesthetic complications  Airway Mallampati: III  TM Distance: >3 FB Neck ROM: Full    Dental  (+) Dental Advisory Given, Caps   Pulmonary neg pulmonary ROS,    Pulmonary exam normal breath sounds clear to auscultation       Cardiovascular Exercise Tolerance: Good hypertension, Pt. on medications  Rhythm:Regular Rate:Normal     Neuro/Psych negative neurological ROS  negative psych ROS   GI/Hepatic GERD  Medicated and Controlled,  Endo/Other    Renal/GU Renal InsufficiencyRenal disease     Musculoskeletal negative musculoskeletal ROS (+)   Abdominal   Peds  Hematology   Anesthesia Other Findings   Reproductive/Obstetrics negative OB ROS                             Anesthesia Physical  Anesthesia Plan  ASA: II  Anesthesia Plan: General   Post-op Pain Management:    Induction: Intravenous  PONV Risk Score and Plan: 2 and Treatment may vary due to age or medical condition  Airway Management Planned:   Additional Equipment:   Intra-op Plan:   Post-operative Plan:   Informed Consent: I have reviewed the patients History and Physical, chart, labs and discussed the procedure including the risks, benefits and alternatives for the proposed anesthesia with the patient or authorized representative who has indicated his/her understanding and acceptance.     Dental advisory given  Plan Discussed with: CRNA and Surgeon  Anesthesia Plan Comments:         Anesthesia Quick Evaluation

## 2020-10-31 NOTE — Discharge Instructions (Signed)
Ureteral Stent Implantation, Care After This sheet gives you information about how to care for yourself after your procedure. Your health care provider may also give you more specific instructions. If you have problems or questions, contact your health care provider. What can I expect after the procedure? After the procedure, it is common to have:  Nausea.  Mild pain when you urinate. You may feel this pain in your lower back or lower abdomen. The pain should stop within a few minutes after you urinate. This may last for up to 1 week.  A small amount of blood in your urine for several days. Follow these instructions at home: Medicines  Take over-the-counter and prescription medicines only as told by your health care provider.  If you were prescribed an antibiotic medicine, take it as told by your health care provider. Do not stop taking the antibiotic even if you start to feel better.  Do not drive for 24 hours if you were given a sedative during your procedure.  Ask your health care provider if the medicine prescribed to you requires you to avoid driving or using heavy machinery. Activity  Rest as told by your health care provider.  Avoid sitting for a long time without moving. Get up to take short walks every 1-2 hours. This is important to improve blood flow and breathing. Ask for help if you feel weak or unsteady.  Return to your normal activities as told by your health care provider. Ask your health care provider what activities are safe for you. General instructions   Watch for any blood in your urine. Call your health care provider if the amount of blood in your urine increases.  If you have a catheter: ? Follow instructions from your health care provider about taking care of your catheter and collection bag. ? Do not take baths, swim, or use a hot tub until your health care provider approves. Ask your health care provider if you may take showers. You may only be allowed to  take sponge baths.  Drink enough fluid to keep your urine pale yellow.  Do not use any products that contain nicotine or tobacco, such as cigarettes, e-cigarettes, and chewing tobacco. These can delay healing after surgery. If you need help quitting, ask your health care provider.  Keep all follow-up visits as told by your health care provider. This is important. Contact a health care provider if:  You have pain that gets worse or does not get better with medicine, especially pain when you urinate.  You have difficulty urinating.  You feel nauseous or you vomit repeatedly during a period of more than 2 days after the procedure. Get help right away if:  Your urine is dark red or has blood clots in it.  You are leaking urine (have incontinence).  The end of the stent comes out of your urethra.  You cannot urinate.  You have sudden, sharp, or severe pain in your abdomen or lower back.  You have a fever.  You have swelling or pain in your legs.  You have difficulty breathing. Summary  After the procedure, it is common to have mild pain when you urinate that goes away within a few minutes after you urinate. This may last for up to 1 week.  Watch for any blood in your urine. Call your health care provider if the amount of blood in your urine increases.  Take over-the-counter and prescription medicines only as told by your health care provider.  Drink   enough fluid to keep your urine pale yellow. This information is not intended to replace advice given to you by your health care provider. Make sure you discuss any questions you have with your health care provider. Document Revised: 09/20/2018 Document Reviewed: 09/21/2018 Elsevier Patient Education  No Name STENT IN 72 HOURS BY GENTLY PULLING THE STRING  11/03/2020 afternoon

## 2020-10-31 NOTE — Anesthesia Postprocedure Evaluation (Signed)
Anesthesia Post Note  Patient: Travis Warner  Procedure(s) Performed: CYSTOSCOPY WITH RETROGRADE PYELOGRAM, URETEROSCOPY AND STENT EXCHANGE (Left ) HOLMIUM LASER APPLICATION (Left )  Patient location during evaluation: PACU Anesthesia Type: General Level of consciousness: awake, oriented and patient cooperative Pain management: pain level controlled Vital Signs Assessment: post-procedure vital signs reviewed and stable Respiratory status: spontaneous breathing, respiratory function stable and nonlabored ventilation Cardiovascular status: stable Postop Assessment: no apparent nausea or vomiting Anesthetic complications: no   No complications documented.   Last Vitals:  Vitals:   10/31/20 1117 10/31/20 1326  BP: (!) 160/94 (!) (P) 149/82  Pulse: 74 (P) 79  Resp: 18 (P) 16  Temp: 36.9 C (!) (P) 36.4 C  SpO2: 100% (P) 99%    Last Pain:  Vitals:   10/31/20 1117  TempSrc: Oral  PainSc: 0-No pain                 Ziva Nunziata

## 2020-10-31 NOTE — Interval H&P Note (Signed)
History and Physical Interval Note:  10/31/2020 12:10 PM  Travis Warner  has presented today for surgery, with the diagnosis of left ureteral calculus.  The various methods of treatment have been discussed with the patient and family. After consideration of risks, benefits and other options for treatment, the patient has consented to  Procedure(s): CYSTOSCOPY WITH RETROGRADE PYELOGRAM, URETEROSCOPY AND STENT EXCHANGE (Left) HOLMIUM LASER APPLICATION (Left) as a surgical intervention.  The patient's history has been reviewed, patient examined, no change in status, stable for surgery.  I have reviewed the patient's chart and labs.  Questions were answered to the patient's satisfaction.     Nicolette Bang

## 2020-10-31 NOTE — Transfer of Care (Signed)
Immediate Anesthesia Transfer of Care Note  Patient: Travis Warner  Procedure(s) Performed: CYSTOSCOPY WITH RETROGRADE PYELOGRAM, URETEROSCOPY AND STENT EXCHANGE (Left ) HOLMIUM LASER APPLICATION (Left )  Patient Location: PACU  Anesthesia Type:General  Level of Consciousness: awake, alert , oriented and patient cooperative  Airway & Oxygen Therapy: Patient Spontanous Breathing  Post-op Assessment: Report given to RN, Post -op Vital signs reviewed and stable and Patient moving all extremities X 4  Post vital signs: Reviewed and stable  Last Vitals:  Vitals Value Taken Time  BP    Temp    Pulse    Resp    SpO2      Last Pain:  Vitals:   10/31/20 1117  TempSrc: Oral  PainSc: 0-No pain      Patients Stated Pain Goal: 5 (82/64/15 8309)  Complications: No complications documented.

## 2020-10-31 NOTE — Op Note (Signed)
.  Preoperative diagnosis: Left ureteral stone  Postoperative diagnosis: Same  Procedure: 1 cystoscopy 2. Left retrograde pyelography 3.  Intraoperative fluoroscopy, under one hour, with interpretation 4.  Left ureteroscopic stone manipulation with laser lithotripsy 5.  Left 6 x 26 JJ stent exchange  Attending: Nicolette Bang  Anesthesia: General  Estimated blood loss: None  Drains: Left 6 x 26 JJ ureteral stent with tether  Specimens: stone for analysis  Antibiotics: ancef  Findings: left renal pelvis stone. No hydronephrosis. No masses/lesions in the bladder. Ureteral orifices in normal anatomic location.  Indications: Patient is a 59 year old male with a history of left ureteral stone and who has persistent left flank pain.  He underwent left ureteral stent placement 2 weeks ago.  After discussing treatment options, he decided proceed with left ureteroscopic stone manipulation.  Procedure her in detail: The patient was brought to the operating room and a brief timeout was done to ensure correct patient, correct procedure, correct site.  General anesthesia was administered patient was placed in dorsal lithotomy position.  Her genitalia was then prepped and draped in usual sterile fashion.  A rigid 36 French cystoscope was passed in the urethra and the bladder.  Bladder was inspected free masses or lesions.  the ureteral orifices were in the normal orthotopic locations.  a 6 french ureteral catheter was then instilled into the left ureteral orifice.  a gentle retrograde was obtained and findings noted above.  Using a grasper the stent was brought to the urethral meatus. we then placed a zip wire through the ureteral stent and advanced up to the renal pelvis. We then placed a sensor wire through the ureteral catheter and up to the renal pelvis.. We then  advanced am 12/14 x 35cm access sheath up to the renal pelvis. We then used the flexible ureteroscope to perform nephroscopy. We  encountered the stone in the renal pelvis.  Using a 200nm laser fiber the stone was fragmented. The fragments were then removed with a Ngage basket.    once all stone fragments were removed we then removed the access sheath under direct vision and noted no injury to the ureter. We then placed a 6 x 26 double-j ureteral stent over the original zip wire.  We then removed the wire and good coil was noted in the the renal pelvis under fluoroscopy and the bladder under direct vision. the bladder was then drained and this concluded the procedure which was well tolerated by patient.  Complications: None  Condition: Stable, extubated, transferred to PACU  Plan: Patient is to be discharged home as to follow-up in one week. He is to remove his stent in 72 hours by pulling the tether.

## 2020-11-01 ENCOUNTER — Encounter (HOSPITAL_COMMUNITY): Payer: Self-pay | Admitting: Urology

## 2020-11-05 LAB — CALCULI, WITH PHOTOGRAPH (CLINICAL LAB)
Calcium Oxalate Dihydrate: 20 %
Calcium Oxalate Monohydrate: 80 %
Weight Calculi: 67 mg

## 2020-11-07 ENCOUNTER — Encounter: Payer: Self-pay | Admitting: Urology

## 2020-11-07 ENCOUNTER — Ambulatory Visit (INDEPENDENT_AMBULATORY_CARE_PROVIDER_SITE_OTHER): Payer: 59 | Admitting: Urology

## 2020-11-07 ENCOUNTER — Other Ambulatory Visit: Payer: Self-pay

## 2020-11-07 VITALS — BP 128/76 | HR 61 | Temp 98.3°F | Ht 69.0 in | Wt 170.0 lb

## 2020-11-07 DIAGNOSIS — N200 Calculus of kidney: Secondary | ICD-10-CM | POA: Diagnosis not present

## 2020-11-07 LAB — URINALYSIS, ROUTINE W REFLEX MICROSCOPIC
Bilirubin, UA: NEGATIVE
Glucose, UA: NEGATIVE
Ketones, UA: NEGATIVE
Leukocytes,UA: NEGATIVE
Nitrite, UA: NEGATIVE
Protein,UA: NEGATIVE
RBC, UA: NEGATIVE
Specific Gravity, UA: 1.025 (ref 1.005–1.030)
Urobilinogen, Ur: 0.2 mg/dL (ref 0.2–1.0)
pH, UA: 7 (ref 5.0–7.5)

## 2020-11-07 NOTE — Progress Notes (Signed)
11/07/2020 11:43 AM   Travis Warner 1961/09/12 [id number]  Referring provider: Sharilyn Sites, [address] [place],  [place] [address]  followup nephrolithiasis  HPI: [name] is a 59yo here for followup for nephrolithiasis. He underwent left ureteroscopic stone extraction 10/31/2020. He removed his stent POD#3. No flank pain. No worsening LUTS.    PMH: Past Medical History:  Diagnosis Date  . Cancer ([place])    skin cancer- basal cell on face  . Chronic kidney disease    kidney stone  . Hypercholesterolemia   . Hypertension   . Insomnia     Surgical History: Past Surgical History:  Procedure Laterality Date  . COLONOSCOPY  02/24/2012   Procedure: COLONOSCOPY;  Surgeon: Daneil Dolin, MD;  [facility];  Service: Endoscopy;  Laterality: N/A;  11:30 AM  . COLONOSCOPY N/A 04/22/2017   Procedure: COLONOSCOPY;  Surgeon: Daneil Dolin, MD;  2 sessile serrated polyp/adenomas with high grade dysplasia. Due for repeat in 2021.   Marland Kitchen COLONOSCOPY WITH PROPOFOL N/A 04/01/2020   Procedure: COLONOSCOPY WITH PROPOFOL;  Surgeon: Daneil Dolin, MD;  [facility];  Service: Endoscopy;  Laterality: N/A;  2:00pm - pt knows to arrive at 8:30  . CYSTOSCOPY WITH RETROGRADE PYELOGRAM, URETEROSCOPY AND STENT PLACEMENT Left 10/14/2020   Procedure: CYSTOSCOPY WITH RETROGRADE PYELOGRAM, DIAGNOSTIC URETEROSCOPY AND STENT PLACEMENT;  Surgeon: Cleon Gustin, MD;  Location: [facility];  Service: Urology;  Laterality: Left;  . CYSTOSCOPY WITH RETROGRADE PYELOGRAM, URETEROSCOPY AND STENT PLACEMENT Left 10/31/2020   Procedure: CYSTOSCOPY WITH RETROGRADE PYELOGRAM, URETEROSCOPY AND STENT EXCHANGE;  Surgeon: Cleon Gustin, MD;  Location: [facility];  Service: Urology;  Laterality: Left;  . HOLMIUM LASER APPLICATION Left 82/03/2352   Procedure: HOLMIUM LASER APPLICATION;  Surgeon: Cleon Gustin, MD;  Location: [facility];  Service: Urology;  Laterality: Left;  . POLYPECTOMY   04/22/2017   Procedure: POLYPECTOMY;  Surgeon: Daneil Dolin, MD;  [facility];  Service: Endoscopy;;  colon  . POLYPECTOMY  04/01/2020   Procedure: POLYPECTOMY;  Surgeon: Daneil Dolin, MD;  [facility];  Service: Endoscopy;;  . Skin cancer removed from face      Home Medications:  Allergies as of 11/07/2020   No Known Allergies     Medication List       Accurate as of November 07, 2020 11:43 AM. If you have any questions, ask your nurse or doctor.        STOP taking these medications   Coenzyme Q10 200 MG capsule Stopped by: Nicolette Bang, MD   zolpidem 10 MG tablet Commonly known as: AMBIEN Stopped by: Nicolette Bang, MD     TAKE these medications   ALPRAZolam 1 MG tablet Commonly known as: XANAX Take 1 mg by mouth daily as needed for anxiety.   olmesartan 20 MG tablet Commonly known as: BENICAR Take 20 mg by mouth daily.   ondansetron 4 MG disintegrating tablet Commonly known as: Zofran ODT Take 1 tablet (4 mg total) by mouth every 8 (eight) hours as needed for nausea or vomiting.   oxyCODONE-acetaminophen 5-325 MG tablet Commonly known as: Percocet Take 1 tablet by mouth every 4 (four) hours as needed.   rosuvastatin 20 MG tablet Commonly known as: CRESTOR Take 20 mg by mouth at bedtime.   tamsulosin 0.4 MG Caps capsule Commonly known as: FLOMAX Take 1 capsule (0.4 mg total) by mouth daily.       Allergies: No  Known Allergies  Family History: Family History  Problem Relation Age of Onset  . COPD Mother   . Liver cancer Father   . Colon cancer Neg Hx     Social History:  reports that he has never smoked. He has never used smokeless tobacco. He reports that he does not drink alcohol and does not use drugs.  ROS: All other review of systems were reviewed and are negative except what is noted above in HPI  Physical Exam: BP 128/76   Pulse 61   Temp 98.3 F (36.8 C)   Ht 5\' 9"  (1.753 m)   Wt 170 lb (77.1 kg)    BMI 25.10 kg/m   Constitutional:  Alert and oriented, No acute distress. HEENT: [place] AT, moist mucus membranes.  Trachea midline, no masses. Cardiovascular: No clubbing, cyanosis, or edema. Respiratory: Normal respiratory effort, no increased work of breathing. GI: Abdomen is soft, nontender, nondistended, no abdominal masses GU: No CVA tenderness.  Lymph: No cervical or inguinal lymphadenopathy. Skin: No rashes, bruises or suspicious lesions. Neurologic: Grossly intact, no focal deficits, moving all 4 extremities. Psychiatric: Normal mood and affect.  Laboratory Data: Lab Results  Component Value Date   WBC 4.8 03/07/2019   HGB 14.2 03/07/2019   HCT 43.1 03/07/2019   MCV 91.7 03/07/2019   PLT 221 03/07/2019    Lab Results  Component Value Date   CREATININE 1.12 03/07/2019    No results found for: PSA  No results found for: TESTOSTERONE  No results found for: HGBA1C  Urinalysis    Component Value Date/Time   COLORURINE YELLOW 10/14/2020 1022   APPEARANCEUR CLEAR 10/14/2020 1022   APPEARANCEUR Clear 10/10/2020 1219   LABSPEC 1.016 10/14/2020 1022   PHURINE 5.0 10/14/2020 1022   GLUCOSEU NEGATIVE 10/14/2020 1022   HGBUR MODERATE (A) 10/14/2020 1022   BILIRUBINUR NEGATIVE 10/14/2020 1022   BILIRUBINUR Negative 10/10/2020 1219   KETONESUR NEGATIVE 10/14/2020 1022   PROTEINUR NEGATIVE 10/14/2020 1022   UROBILINOGEN 0.2 12/26/2011 1302   NITRITE NEGATIVE 10/14/2020 1022   LEUKOCYTESUR NEGATIVE 10/14/2020 1022    Lab Results  Component Value Date   LABMICR See below: 10/10/2020   WBCUA None seen 10/10/2020   LABEPIT None seen 10/10/2020   BACTERIA NONE SEEN 10/14/2020    Pertinent Imaging:  Results for orders placed in visit on 10/10/20  Abdomen 1 view (KUB)  Narrative CLINICAL DATA:  Nephrolithiasis.  EXAM: ABDOMEN - 1 VIEW  COMPARISON:  None.  FINDINGS: There is a 7 mm calcification projected over the superolateral aspect of the left L3 transverse  process. Probable tiny stone in the right mid kidney. No other abnormalities.  IMPRESSION: 1. 7 mm calcification projected over the superolateral aspect of the left L3 transverse process is likely a proximal left ureteral stone. Probable stone in the right kidney measuring between 3 and 4 mm.   Electronically Signed By: Dorise Bullion III M.D On: 10/12/2020 11:01  No results found for this or any previous visit.  No results found for this or any previous visit.  No results found for this or any previous visit.  Results for orders placed during the hospital encounter of 08/20/09  US Renal  Narrative Clinical Data: Urinary frequency.  RENAL/URINARY TRACT ULTRASOUND COMPLETE  Comparison: 08/22/2005 CT scan.  Findings:  Right Kidney:  11.6 cm in length. Normal renal cortical thickness and echogenicity without focal lesions or hydronephrosis.  Left Kidney:  10.0 cm in length. Normal renal cortical thickness and echogenicity without  focal lesions or hydronephrosis.  Bladder:  Normal.  Bilateral ureteral jets are noted.  IMPRESSION: Unremarkable renal ultrasound examination.  Provider: Eldridge Scot  No results found for this or any previous visit.  No results found for this or any previous visit.  No results found for this or any previous visit.   Assessment & Plan:    1. Kidney stones -RTC 4 weeks with renal US - Urinalysis, Routine w reflex microscopic   No follow-ups on file.  Nicolette Bang, MD  [facility] [place]

## 2020-11-07 NOTE — Progress Notes (Signed)
Urological Symptom Review  Patient is experiencing the following symptoms: Kidney stones   Review of Systems  Gastrointestinal (upper)  : Negative for upper GI symptoms  Gastrointestinal (lower) : Negative for lower GI symptoms  Constitutional : Negative for symptoms  Skin: Negative for skin symptoms  Eyes: Negative for eye symptoms  Ear/Nose/Throat : Negative for Ear/Nose/Throat symptoms  Hematologic/Lymphatic: Negative for Hematologic/Lymphatic symptoms  Cardiovascular : Negative for cardiovascular symptoms  Respiratory : Negative for respiratory symptoms  Endocrine: Negative for endocrine symptoms  Musculoskeletal: Negative for musculoskeletal symptoms  Neurological: Negative for neurological symptoms  Psychologic: Negative for psychiatric symptoms

## 2020-11-07 NOTE — Patient Instructions (Signed)
Dietary Guidelines to Help Prevent Kidney Stones Kidney stones are deposits of minerals and salts that form inside your kidneys. Your risk of developing kidney stones may be greater depending on your diet, your lifestyle, the medicines you take, and whether you have certain medical conditions. Most people can reduce their chances of developing kidney stones by following the instructions below. Depending on your overall health and the type of kidney stones you tend to develop, your dietitian may give you more specific instructions. What are tips for following this plan? Reading food labels  Choose foods with "no salt added" or "low-salt" labels. Limit your sodium intake to less than 1500 mg per day.  Choose foods with calcium for each meal and snack. Try to eat about 300 mg of calcium at each meal. Foods that contain 200-500 mg of calcium per serving include: ? 8 oz (237 ml) of milk, fortified nondairy milk, and fortified fruit juice. ? 8 oz (237 ml) of kefir, yogurt, and soy yogurt. ? 4 oz (118 ml) of tofu. ? 1 oz of cheese. ? 1 cup (300 g) of dried figs. ? 1 cup (91 g) of cooked broccoli. ? 1-3 oz can of sardines or mackerel.  Most people need 1000 to 1500 mg of calcium each day. Talk to your dietitian about how much calcium is recommended for you. Shopping  Buy plenty of fresh fruits and vegetables. Most people do not need to avoid fruits and vegetables, even if they contain nutrients that may contribute to kidney stones.  When shopping for convenience foods, choose: ? Whole pieces of fruit. ? Premade salads with dressing on the side. ? Low-fat fruit and yogurt smoothies.  Avoid buying frozen meals or prepared deli foods.  Look for foods with live cultures, such as yogurt and kefir. Cooking  Do not add salt to food when cooking. Place a salt shaker on the table and allow each person to add his or her own salt to taste.  Use vegetable protein, such as beans, textured vegetable  protein (TVP), or tofu instead of meat in pasta, casseroles, and soups. Meal planning   Eat less salt, if told by your dietitian. To do this: ? Avoid eating processed or premade food. ? Avoid eating fast food.  Eat less animal protein, including cheese, meat, poultry, or fish, if told by your dietitian. To do this: ? Limit the number of times you have meat, poultry, fish, or cheese each week. Eat a diet free of meat at least 2 days a week. ? Eat only one serving each day of meat, poultry, fish, or seafood. ? When you prepare animal protein, cut pieces into small portion sizes. For most meat and fish, one serving is about the size of one deck of cards.  Eat at least 5 servings of fresh fruits and vegetables each day. To do this: ? Keep fruits and vegetables on hand for snacks. ? Eat 1 piece of fruit or a handful of berries with breakfast. ? Have a salad and fruit at lunch. ? Have two kinds of vegetables at dinner.  Limit foods that are high in a substance called oxalate. These include: ? Spinach. ? Rhubarb. ? Beets. ? Potato chips and french fries. ? Nuts.  If you regularly take a diuretic medicine, make sure to eat at least 1-2 fruits or vegetables high in potassium each day. These include: ? Avocado. ? Banana. ? Orange, prune, carrot, or tomato juice. ? Baked potato. ? Cabbage. ? Beans and split   peas. General instructions   Drink enough fluid to keep your urine clear or pale yellow. This is the most important thing you can do.  Talk to your health care provider and dietitian about taking daily supplements. Depending on your health and the cause of your kidney stones, you may be advised: ? Not to take supplements with vitamin C. ? To take a calcium supplement. ? To take a daily probiotic supplement. ? To take other supplements such as magnesium, fish oil, or vitamin B6.  Take all medicines and supplements as told by your health care provider.  Limit alcohol intake to no  more than 1 drink a day for nonpregnant women and 2 drinks a day for men. One drink equals 12 oz of beer, 5 oz of wine, or 1 oz of hard liquor.  Lose weight if told by your health care provider. Work with your dietitian to find strategies and an eating plan that works best for you. What foods are not recommended? Limit your intake of the following foods, or as told by your dietitian. Talk to your dietitian about specific foods you should avoid based on the type of kidney stones and your overall health. Grains Breads. Bagels. Rolls. Baked goods. Salted crackers. Cereal. Pasta. Vegetables Spinach. Rhubarb. Beets. Canned vegetables. Pickles. Olives. Meats and other protein foods Nuts. Nut butters. Large portions of meat, poultry, or fish. Salted or cured meats. Deli meats. Hot dogs. Sausages. Dairy Cheese. Beverages Regular soft drinks. Regular vegetable juice. Seasonings and other foods Seasoning blends with salt. Salad dressings. Canned soups. Soy sauce. Ketchup. Barbecue sauce. Canned pasta sauce. Casseroles. Pizza. Lasagna. Frozen meals. Potato chips. French fries. Summary  You can reduce your risk of kidney stones by making changes to your diet.  The most important thing you can do is drink enough fluid. You should drink enough fluid to keep your urine clear or pale yellow.  Ask your health care provider or dietitian how much protein from animal sources you should eat each day, and also how much salt and calcium you should have each day. This information is not intended to replace advice given to you by your health care provider. Make sure you discuss any questions you have with your health care provider. Document Revised: 04/05/2019 Document Reviewed: 11/24/2016 Elsevier Patient Education  2020 Elsevier [employer]  

## 2020-11-10 ENCOUNTER — Other Ambulatory Visit: Payer: Self-pay

## 2020-11-10 ENCOUNTER — Ambulatory Visit
Admission: EM | Admit: 2020-11-10 | Discharge: 2020-11-10 | Disposition: A | Discharge status: Home / Self Care | Payer: 59 | Attending: Emergency Medicine | Admitting: Emergency Medicine

## 2020-11-10 DIAGNOSIS — Z20822 Contact with and (suspected) exposure to covid-19: Secondary | ICD-10-CM

## 2020-11-10 DIAGNOSIS — R0981 Nasal congestion: Secondary | ICD-10-CM

## 2020-11-10 DIAGNOSIS — R059 Cough, unspecified: Secondary | ICD-10-CM | POA: Diagnosis not present

## 2020-11-10 DIAGNOSIS — Z1152 Encounter for screening for COVID-19: Secondary | ICD-10-CM

## 2020-11-10 MED ORDER — BENZONATATE 100 MG PO CAPS: 100.0000 mg | ORAL_CAPSULE | Freq: Three times a day (TID) | ORAL | 0 refills | Status: DC

## 2020-11-10 MED ORDER — CETIRIZINE-PSEUDOEPHEDRINE ER 5-120 MG PO TB12: 1.0000 | ORAL_TABLET | Freq: Every day | ORAL | 0 refills | Status: DC

## 2020-11-10 NOTE — Discharge Instructions (Signed)
COVID testing ordered.  It will take between 5-7 days for test results.  Someone will contact you regarding abnormal results.    In the meantime: You should remain isolated in your home for 10 days from symptom onset AND greater than 72 hours after symptoms resolution (absence of fever without the use of fever-reducing medication and improvement in respiratory symptoms), whichever is longer Get plenty of rest and push fluids Tessalon Perles prescribed for cough Zyrtec d for congestion Use OTC flonase for nasal congestion and runny nose Use medications daily for symptom relief Use OTC medications like ibuprofen or tylenol as needed fever or pain Call or go to the ED if you have any new or worsening symptoms such as fever, worsening cough, shortness of breath, chest tightness, chest pain, turning blue, changes in mental status, etc..Marland Kitchen

## 2020-11-10 NOTE — ED Triage Notes (Signed)
Pt presents with nasal congestion and cough after covid exposure

## 2020-11-10 NOTE — ED Provider Notes (Signed)
[place]   [id number] 11/10/20 Arrival Time: 2694   CC: COVID symptoms  SUBJECTIVE: History from: patient.  Travis Warner is a 59 y.o. male who presents with nasal congestion and cough x 2 days.  Reports covid exposure.  Has NOT tried OTC medications.  Denies aggravating factors  Denies previous covid infection in the past.   Reports having covid vaccines.  Denies fever, chills, SOB, wheezing, chest pain, nausea, changes in bowel or bladder habits.    ROS: As per HPI.  All other pertinent ROS negative.     Past Medical History:  Diagnosis Date  . Cancer ([place])    skin cancer- basal cell on face  . Chronic kidney disease    kidney stone  . Hypercholesterolemia   . Hypertension   . Insomnia    Past Surgical History:  Procedure Laterality Date  . COLONOSCOPY  02/24/2012   Procedure: COLONOSCOPY;  Surgeon: Daneil Dolin, MD;  [facility];  Service: Endoscopy;  Laterality: N/A;  11:30 AM  . COLONOSCOPY N/A 04/22/2017   Procedure: COLONOSCOPY;  Surgeon: Daneil Dolin, MD;  2 sessile serrated polyp/adenomas with high grade dysplasia. Due for repeat in 2021.   Marland Kitchen COLONOSCOPY WITH PROPOFOL N/A 04/01/2020   Procedure: COLONOSCOPY WITH PROPOFOL;  Surgeon: Daneil Dolin, MD;  [facility];  Service: Endoscopy;  Laterality: N/A;  2:00pm - pt knows to arrive at 8:30  . CYSTOSCOPY WITH RETROGRADE PYELOGRAM, URETEROSCOPY AND STENT PLACEMENT Left 10/14/2020   Procedure: CYSTOSCOPY WITH RETROGRADE PYELOGRAM, DIAGNOSTIC URETEROSCOPY AND STENT PLACEMENT;  Surgeon: Cleon Gustin, MD;  Location: [facility];  Service: Urology;  Laterality: Left;  . CYSTOSCOPY WITH RETROGRADE PYELOGRAM, URETEROSCOPY AND STENT PLACEMENT Left 10/31/2020   Procedure: CYSTOSCOPY WITH RETROGRADE PYELOGRAM, URETEROSCOPY AND STENT EXCHANGE;  Surgeon: Cleon Gustin, MD;  [facility];  Service: Urology;  Laterality: Left;  . HOLMIUM LASER APPLICATION Left 85/03/6269   Procedure:  HOLMIUM LASER APPLICATION;  Surgeon: Cleon Gustin, MD;  Location[facility];  Service: Urology;  Laterality: Left;  . POLYPECTOMY  04/22/2017   Procedure: POLYPECTOMY;  Surgeon: Daneil Dolin, MD;  [facility];  Service: Endoscopy;;  colon  . POLYPECTOMY  04/01/2020   Procedure: POLYPECTOMY;  Surgeon: Daneil Dolin, MD;  [facility];  Service: Endoscopy;;  . Skin cancer removed from face     No Known Allergies Current Facility-Administered Medications on File Prior to Encounter  Medication Dose Route Frequency Provider Last Rate Last Admin  . water for irrigation, sterile for irrigation SOLN    PRN Cleon Gustin, MD   1,000 mL at 10/14/20 1310   Current Outpatient Medications on File Prior to Encounter  Medication Sig Dispense Refill  . ALPRAZolam (XANAX) 1 MG tablet Take 1 mg by mouth daily as needed for anxiety.     Marland Kitchen olmesartan (BENICAR) 20 MG tablet Take 20 mg by mouth daily.     Marland Kitchen oxyCODONE-acetaminophen (PERCOCET) 5-325 MG tablet Take 1 tablet by mouth every 4 (four) hours as needed. 30 tablet 0  . rosuvastatin (CRESTOR) 20 MG tablet Take 20 mg by mouth at bedtime.    . tamsulosin (FLOMAX) 0.4 MG CAPS capsule Take 1 capsule (0.4 mg total) by mouth daily. 30 capsule 1   Social History   Socioeconomic History  . Marital status: Married    Spouse name: Not on file  . Number of children: 2  . Years of education:  Not on file  . Highest education level: Not on file  Occupational History  . Not on file  Tobacco Use  . Smoking status: Never Smoker  . Smokeless tobacco: Never Used  Vaping Use  . Vaping Use: Never used  Substance and Sexual Activity  . Alcohol use: No  . Drug use: No  . Sexual activity: Yes  Other Topics Concern  . Not on file  Social History Narrative  . Not on file   Social Determinants of Health   Financial Resource Strain:   . Difficulty of Paying Living Expenses: Not on file  Food Insecurity:   . Worried About  Charity fundraiser in the Last Year: Not on file  . Ran Out of Food in the Last Year: Not on file  Transportation Needs:   . Lack of Transportation (Medical): Not on file  . Lack of Transportation (Non-Medical): Not on file  Physical Activity:   . Days of Exercise per Week: Not on file  . Minutes of Exercise per Session: Not on file  Stress:   . Feeling of Stress : Not on file  Social Connections:   . Frequency of Communication with Friends and Family: Not on file  . Frequency of Social Gatherings with Friends and Family: Not on file  . Attends Religious Services: Not on file  . Active Member of Clubs or Organizations: Not on file  . Attends Archivist Meetings: Not on file  . Marital Status: Not on file  Intimate Partner Violence:   . Fear of Current or Ex-Partner: Not on file  . Emotionally Abused: Not on file  . Physically Abused: Not on file  . Sexually Abused: Not on file   Family History  Problem Relation Age of Onset  . COPD Mother   . Liver cancer Father   . Colon cancer Neg Hx     OBJECTIVE:  Vitals:   11/10/20 1000  BP: 131/74  Pulse: 79  Resp: 20  Temp: 98.9 F (37.2 C)  SpO2: 98%    General appearance: alert; well-appearing, nontoxic; speaking in full sentences and tolerating own secretions HEENT: NCAT; Ears: EACs clear, TMs pearly gray; Eyes: PERRL.  EOM grossly intact.Nose: nares patent without rhinorrhea, Throat: oropharynx clear, tonsils non erythematous or enlarged, uvula midline  Neck: supple without LAD Lungs: unlabored respirations, symmetrical air entry; cough: absent; no respiratory distress; CTAB Heart: regular rate and rhythm.  Skin: warm and dry Psychological: alert and cooperative; normal mood and affect   ASSESSMENT & PLAN:  1. Encounter for screening for COVID-19   2. Cough   3. Nasal congestion   4. Exposure to COVID-19 virus     Meds ordered this encounter  Medications  . cetirizine-pseudoephedrine (ZYRTEC-D) 5-120 MG  tablet    Sig: Take 1 tablet by mouth daily.    Dispense:  30 tablet    Refill:  0    Order Specific Question:   Supervising Provider    Answer:   Raylene Everts [[id number]]  . benzonatate (TESSALON) 100 MG capsule    Sig: Take 1 capsule (100 mg total) by mouth every 8 (eight) hours.    Dispense:  21 capsule    Refill:  0    Order Specific Question:   Supervising Provider    Answer:   Raylene Everts [[id number]]   COVID testing ordered.  It will take between 5-7 days for test results.  Someone will contact you regarding abnormal results.  In the meantime: You should remain isolated in your home for 10 days from symptom onset AND greater than 72 hours after symptoms resolution (absence of fever without the use of fever-reducing medication and improvement in respiratory symptoms), whichever is longer Get plenty of rest and push fluids Tessalon Perles prescribed for cough Zyrtec d for congestion Use OTC flonase for nasal congestion and runny nose Use medications daily for symptom relief Use OTC medications like ibuprofen or tylenol as needed fever or pain Call or go to the ED if you have any new or worsening symptoms such as fever, worsening cough, shortness of breath, chest tightness, chest pain, turning blue, changes in mental status, etc...   Reviewed expectations re: course of current medical issues. Questions answered. Outlined signs and symptoms indicating need for more acute intervention. Patient verbalized understanding. After Visit Summary given.         Lestine Box, PA-C 11/10/20 1017

## 2020-11-11 LAB — SARS-COV-2, NAA 2 DAY TAT

## 2020-11-11 LAB — NOVEL CORONAVIRUS, NAA: SARS-CoV-2, NAA: DETECTED — AB

## 2020-11-12 ENCOUNTER — Encounter: Payer: Self-pay | Admitting: Oncology

## 2020-11-12 ENCOUNTER — Other Ambulatory Visit: Payer: Self-pay | Admitting: Oncology

## 2020-11-12 ENCOUNTER — Telehealth: Payer: Self-pay | Admitting: Adult Health

## 2020-11-12 ENCOUNTER — Other Ambulatory Visit (HOSPITAL_COMMUNITY): Payer: 59

## 2020-11-12 DIAGNOSIS — U071 COVID-19: Secondary | ICD-10-CM

## 2020-11-12 NOTE — Progress Notes (Signed)
I connected by phone with  Travis Warner to discuss the potential use of an new treatment for mild to moderate COVID-19 viral infection in non-hospitalized patients.   This patient is a age/sex that meets the FDA criteria for Emergency Use Authorization of casirivimab\imdevimab.  Has a (+) direct SARS-CoV-2 viral test result 1. Has mild or moderate COVID-19  2. Is ? 59 years of age and weighs ? 40 kg 3. Is NOT hospitalized due to COVID-19 4. Is NOT requiring oxygen therapy or requiring an increase in baseline oxygen flow rate due to COVID-19 5. Is within 10 days of symptom onset 6. Has at least one of the high risk factor(s) for progression to severe COVID-19 and/or hospitalization as defined in EUA. Specific high risk criteria : Past Medical History:  Diagnosis Date  . Cancer ([place])    skin cancer- basal cell on face  . Chronic kidney disease    kidney stone  . Hypercholesterolemia   . Hypertension   . Insomnia   ?  ?    Symptom onset  11/08/20   I have spoken and communicated the following to the patient or parent/caregiver:   1. FDA has authorized the emergency use of casirivimab\imdevimab for the treatment of mild to moderate COVID-19 in adults and pediatric patients with positive results of direct SARS-CoV-2 viral testing who are 18 years of age and older weighing at least 40 kg, and who are at high risk for progressing to severe COVID-19 and/or hospitalization.   2. The significant known and potential risks and benefits of casirivimab\imdevimab, and the extent to which such potential risks and benefits are unknown.   3. Information on available alternative treatments and the risks and benefits of those alternatives, including clinical trials.   4. Patients treated with casirivimab\imdevimab should continue to self-isolate and use infection control measures (e.g., wear mask, isolate, social distance, avoid sharing personal items, clean and disinfect "high touch" surfaces, and  frequent handwashing) according to CDC guidelines.    5. The patient or parent/caregiver has the option to accept or refuse casirivimab\imdevimab .   After reviewing this information with the patient, The patient agreed to proceed with receiving casirivimab\imdevimab infusion and will be provided a copy of the Fact sheet prior to receiving the infusion.Rulon Abide, AGNP-C [id number] ([place])

## 2020-11-12 NOTE — Telephone Encounter (Signed)
Talked with patient and his wife about monoclonal antibody treatment for COVID 19.  They are going to think about this treatment and will inquire with insurance about cost.  My chart sent.    Wilber Bihari, NP

## 2020-11-13 ENCOUNTER — Ambulatory Visit (HOSPITAL_COMMUNITY)
Admission: RE | Admit: 2020-11-13 | Discharge: 2020-11-13 | Disposition: A | Discharge status: Home / Self Care | Payer: 59 | Source: Ambulatory Visit | Attending: Pulmonary Disease | Admitting: Pulmonary Disease

## 2020-11-13 DIAGNOSIS — Z6825 Body mass index (BMI) 25.0-25.9, adult: Secondary | ICD-10-CM | POA: Diagnosis not present

## 2020-11-13 DIAGNOSIS — U071 COVID-19: Secondary | ICD-10-CM | POA: Insufficient documentation

## 2020-11-13 MED ORDER — ALBUTEROL SULFATE HFA 108 (90 BASE) MCG/ACT IN AERS: 2.0000 | INHALATION_SPRAY | Freq: Once | RESPIRATORY_TRACT | Status: DC | PRN

## 2020-11-13 MED ORDER — SODIUM CHLORIDE 0.9 % IV SOLN: INTRAVENOUS | Status: DC | PRN

## 2020-11-13 MED ORDER — METHYLPREDNISOLONE SODIUM SUCC 125 MG IJ SOLR: 125.0000 mg | Freq: Once | INTRAMUSCULAR | Status: DC | PRN

## 2020-11-13 MED ORDER — FAMOTIDINE IN NACL 20-0.9 MG/50ML-% IV SOLN: 20.0000 mg | Freq: Once | INTRAVENOUS | Status: DC | PRN

## 2020-11-13 MED ORDER — EPINEPHRINE 0.3 MG/0.3ML IJ SOAJ: 0.3000 mg | Freq: Once | INTRAMUSCULAR | Status: DC | PRN

## 2020-11-13 MED ORDER — SOTROVIMAB 500 MG/8ML IV SOLN
500.0000 mg | Freq: Once | INTRAVENOUS | Status: AC
  Administered 2020-11-13: 500 mg via INTRAVENOUS

## 2020-11-13 MED ORDER — DIPHENHYDRAMINE HCL 50 MG/ML IJ SOLN: 50.0000 mg | Freq: Once | INTRAMUSCULAR | Status: DC | PRN

## 2020-11-13 NOTE — Discharge Instructions (Signed)

## 2020-11-13 NOTE — Progress Notes (Signed)
Diagnosis: COVID-19  Physician: Dr. Patrick Wright  Procedure: [facility] Med: Sotrovimab infusion - Provided patient with sotrovimab fact sheet for patients, parents, and caregivers prior to infusion.   Complications: No immediate complications noted  Discharge: Discharged home  If after the infusion you have any questions or concerns please call the Advanced Practice Provider at [phone] 

## 2020-11-14 DIAGNOSIS — N200 Calculus of kidney: Secondary | ICD-10-CM

## 2020-12-06 ENCOUNTER — Other Ambulatory Visit: Payer: Self-pay

## 2020-12-06 ENCOUNTER — Ambulatory Visit (HOSPITAL_COMMUNITY)
Admission: RE | Admit: 2020-12-06 | Discharge: 2020-12-06 | Disposition: A | Discharge status: Home / Self Care | Payer: 59 | Source: Ambulatory Visit | Attending: Urology | Admitting: Urology

## 2020-12-06 DIAGNOSIS — N200 Calculus of kidney: Secondary | ICD-10-CM | POA: Diagnosis not present

## 2020-12-11 ENCOUNTER — Encounter: Payer: Self-pay | Admitting: Urology

## 2020-12-11 ENCOUNTER — Other Ambulatory Visit: Payer: Self-pay

## 2020-12-11 ENCOUNTER — Ambulatory Visit (INDEPENDENT_AMBULATORY_CARE_PROVIDER_SITE_OTHER): Payer: 59 | Admitting: Urology

## 2020-12-11 VITALS — BP 131/76 | HR 67 | Temp 98.3°F | Ht 69.0 in | Wt 170.0 lb

## 2020-12-11 DIAGNOSIS — N200 Calculus of kidney: Secondary | ICD-10-CM

## 2020-12-11 NOTE — Progress Notes (Signed)
12/11/2020 1:08 PM   Travis Warner [birth year] [id number]  Referring provider: Sharilyn Sites, [place],  [place] [address]  nephrolithiasis  HPI: [name] is a 59yo here for followup for nephrolithiasis. Renal US shows normal left kidney and a 33mm right lower pole calculus. He drinks 32oz of water daily. He has intermittent left flank pain that is rare.    PMH: Past Medical History:  Diagnosis Date  . Cancer ([place])    skin cancer- basal cell on face  . Chronic kidney disease    kidney stone  . Hypercholesterolemia   . Hypertension   . Insomnia     Surgical History: Past Surgical History:  Procedure Laterality Date  . COLONOSCOPY  02/24/2012   Procedure: COLONOSCOPY;  Surgeon: Daneil Dolin, MD;  [facility];  Service: Endoscopy;  Laterality: N/A;  11:30 AM  . COLONOSCOPY N/A 04/22/2017   Procedure: COLONOSCOPY;  Surgeon: Daneil Dolin, MD;  2 sessile serrated polyp/adenomas with high grade dysplasia. Due for repeat in 2021.   Marland Kitchen COLONOSCOPY WITH PROPOFOL N/A 04/01/2020   Procedure: COLONOSCOPY WITH PROPOFOL;  Surgeon: Daneil Dolin, MD;  [facility];  Service: Endoscopy;  Laterality: N/A;  2:00pm - pt knows to arrive at 8:30  . CYSTOSCOPY WITH RETROGRADE PYELOGRAM, URETEROSCOPY AND STENT PLACEMENT Left 10/14/2020   Procedure: CYSTOSCOPY WITH RETROGRADE PYELOGRAM, DIAGNOSTIC URETEROSCOPY AND STENT PLACEMENT;  Surgeon: Cleon Gustin, MD;  Location: [facility];  Service: Urology;  Laterality: Left;  . CYSTOSCOPY WITH RETROGRADE PYELOGRAM, URETEROSCOPY AND STENT PLACEMENT Left 10/31/2020   Procedure: CYSTOSCOPY WITH RETROGRADE PYELOGRAM, URETEROSCOPY AND STENT EXCHANGE;  Surgeon: Cleon Gustin, MD;  [facility];  Service: Urology;  Laterality: Left;  . HOLMIUM LASER APPLICATION Left 93/07/1016   Procedure: HOLMIUM LASER APPLICATION;  Surgeon: Cleon Gustin, MD;  [facility];  Service: Urology;  Laterality: Left;   . POLYPECTOMY  04/22/2017   Procedure: POLYPECTOMY;  Surgeon: Daneil Dolin, MD;  [facility];  Service: Endoscopy;;  colon  . POLYPECTOMY  04/01/2020   Procedure: POLYPECTOMY;  Surgeon: Daneil Dolin, MD;  [facility];  Service: Endoscopy;;  . Skin cancer removed from face      Home Medications:  Allergies as of 12/11/2020   No Known Allergies     Medication List       Accurate as of December 11, 2020  1:08 PM. If you have any questions, ask your nurse or doctor.        ALPRAZolam 1 MG tablet Commonly known as: XANAX Take 1 mg by mouth daily as needed for anxiety.   benzonatate 100 MG capsule Commonly known as: TESSALON Take 1 capsule (100 mg total) by mouth every 8 (eight) hours.   cetirizine-pseudoephedrine 5-120 MG tablet Commonly known as: ZYRTEC-D Take 1 tablet by mouth daily.   olmesartan 20 MG tablet Commonly known as: BENICAR Take 20 mg by mouth daily.   oxyCODONE-acetaminophen 5-325 MG tablet Commonly known as: Percocet Take 1 tablet by mouth every 4 (four) hours as needed.   rosuvastatin 20 MG tablet Commonly known as: CRESTOR Take 20 mg by mouth at bedtime.   tamsulosin 0.4 MG Caps capsule Commonly known as: FLOMAX Take 1 capsule (0.4 mg total) by mouth daily.       Allergies: No Known Allergies  Family History: Family History  Problem Relation Age of Onset  . COPD Mother   . Liver cancer  Father   . Colon cancer Neg Hx     Social History:  reports that he has never smoked. He has never used smokeless tobacco. He reports that he does not drink alcohol and does not use drugs.  ROS: All other review of systems were reviewed and are negative except what is noted above in HPI  Physical Exam: BP 131/76   Pulse 67   Temp 98.3 F (36.8 C)   Ht 5\' 9"  (1.753 m)   Wt 170 lb (77.1 kg)   BMI 25.10 kg/m   Constitutional:  Alert and oriented, No acute distress. HEENT: [place] AT, moist mucus membranes.  Trachea midline, no  masses. Cardiovascular: No clubbing, cyanosis, or edema. Respiratory: Normal respiratory effort, no increased work of breathing. GI: Abdomen is soft, nontender, nondistended, no abdominal masses GU: No CVA tenderness.  Lymph: No cervical or inguinal lymphadenopathy. Skin: No rashes, bruises or suspicious lesions. Neurologic: Grossly intact, no focal deficits, moving all 4 extremities. Psychiatric: Normal mood and affect.  Laboratory Data: Lab Results  Component Value Date   WBC 4.8 03/07/2019   HGB 14.2 03/07/2019   HCT 43.1 03/07/2019   MCV 91.7 03/07/2019   PLT 221 03/07/2019    Lab Results  Component Value Date   CREATININE 1.12 03/07/2019    No results found for: PSA  No results found for: TESTOSTERONE  No results found for: HGBA1C  Urinalysis    Component Value Date/Time   COLORURINE YELLOW 10/14/2020 1022   APPEARANCEUR Clear 11/07/2020 1143   LABSPEC 1.016 10/14/2020 1022   PHURINE 5.0 10/14/2020 1022   GLUCOSEU Negative 11/07/2020 1143   HGBUR MODERATE (A) 10/14/2020 1022   BILIRUBINUR Negative 11/07/2020 1143   KETONESUR NEGATIVE 10/14/2020 1022   PROTEINUR Negative 11/07/2020 1143   PROTEINUR NEGATIVE 10/14/2020 1022   UROBILINOGEN 0.2 12/26/2011 1302   NITRITE Negative 11/07/2020 1143   NITRITE NEGATIVE 10/14/2020 1022   LEUKOCYTESUR Negative 11/07/2020 1143   LEUKOCYTESUR NEGATIVE 10/14/2020 1022    Lab Results  Component Value Date   LABMICR Comment 11/07/2020   WBCUA None seen 10/10/2020   LABEPIT None seen 10/10/2020   BACTERIA NONE SEEN 10/14/2020    Pertinent Imaging: Renal US 12/06/2020: Images reviewed and discussed with the patient Results for orders placed in visit on 10/10/20  Abdomen 1 view (KUB)  Narrative CLINICAL DATA:  Nephrolithiasis.  EXAM: ABDOMEN - 1 VIEW  COMPARISON:  None.  FINDINGS: There is a 7 mm calcification projected over the superolateral aspect of the left L3 transverse process. Probable tiny stone in  the right mid kidney. No other abnormalities.  IMPRESSION: 1. 7 mm calcification projected over the superolateral aspect of the left L3 transverse process is likely a proximal left ureteral stone. Probable stone in the right kidney measuring between 3 and 4 mm.   Electronically Signed By: Dorise Bullion III M.D On: 10/12/2020 11:01  No results found for this or any previous visit.  No results found for this or any previous visit.  No results found for this or any previous visit.  Results for orders placed during the hospital encounter of 12/06/20  Ultrasound renal complete  Narrative CLINICAL DATA:  Nephrolithiasis. CT abdomen pelvis with contrast/12/20  EXAM: RENAL / URINARY TRACT ULTRASOUND COMPLETE  COMPARISON:  CT abdomen and pelvis with contrast 05/09/2019. One-view abdomen radiograph 10/10/2020  FINDINGS: Right Kidney:  Renal measurements: 10.0 x 5.3 x 6.0 cm = volume: 165 mL. Echogenicity within normal limits. A 6 mm shadowing calculus  is present in midportion of the right kidney. Additional stones are present. There is no hydronephrosis. Renal parenchyma is within normal limits.  Left Kidney:  Renal measurements: 10.6 x 5.8 x 5.0 cm = volume: 160 mL. Echogenicity within normal limits. No mass or hydronephrosis visualized.  Bladder:  Appears normal for degree of bladder distention.  Other:  None.  IMPRESSION: 1. Right-sided nephrolithiasis without evidence for obstruction. 2. Left kidney is within normal limits.   Electronically Signed By: San Morelle M.D. On: 12/06/2020 15:29  No results found for this or any previous visit.  No results found for this or any previous visit.  No results found for this or any previous visit.   Assessment & Plan:    1. Kidney stones -dietary handout -RTC prn   No follow-ups on file.  Nicolette Bang, MD  [facility] [place]

## 2020-12-11 NOTE — Addendum Note (Signed)
Addended by: Iris Pert on: 12/11/2020 03:46 PM   Modules accepted: Orders

## 2020-12-11 NOTE — Progress Notes (Signed)
Urological Symptom Review  Patient is experiencing the following symptoms: Kidney stones   Review of Systems  Gastrointestinal (upper)  : Negative for upper GI symptoms  Gastrointestinal (lower) : Negative for lower GI symptoms  Constitutional : Negative for symptoms  Skin: Negative for skin symptoms  Eyes: Negative for eye symptoms  Ear/Nose/Throat : Negative for Ear/Nose/Throat symptoms  Hematologic/Lymphatic: Negative for Hematologic/Lymphatic symptoms  Cardiovascular : Negative for cardiovascular symptoms  Respiratory : Negative for respiratory symptoms  Endocrine: Negative for endocrine symptoms  Musculoskeletal: Negative for musculoskeletal symptoms  Neurological: Negative for neurological symptoms  Psychologic: Negative for psychiatric symptoms

## 2020-12-11 NOTE — Patient Instructions (Signed)
Dietary Guidelines to Help Prevent Kidney Stones Kidney stones are deposits of minerals and salts that form inside your kidneys. Your risk of developing kidney stones may be greater depending on your diet, your lifestyle, the medicines you take, and whether you have certain medical conditions. Most people can reduce their chances of developing kidney stones by following the instructions below. Depending on your overall health and the type of kidney stones you tend to develop, your dietitian may give you more specific instructions. What are tips for following this plan? Reading food labels  Choose foods with "no salt added" or "low-salt" labels. Limit your sodium intake to less than 1500 mg per day.  Choose foods with calcium for each meal and snack. Try to eat about 300 mg of calcium at each meal. Foods that contain 200-500 mg of calcium per serving include: ? 8 oz (237 ml) of milk, fortified nondairy milk, and fortified fruit juice. ? 8 oz (237 ml) of kefir, yogurt, and soy yogurt. ? 4 oz (118 ml) of tofu. ? 1 oz of cheese. ? 1 cup (300 g) of dried figs. ? 1 cup (91 g) of cooked broccoli. ? 1-3 oz can of sardines or mackerel.  Most people need 1000 to 1500 mg of calcium each day. Talk to your dietitian about how much calcium is recommended for you. Shopping  Buy plenty of fresh fruits and vegetables. Most people do not need to avoid fruits and vegetables, even if they contain nutrients that may contribute to kidney stones.  When shopping for convenience foods, choose: ? Whole pieces of fruit. ? Premade salads with dressing on the side. ? Low-fat fruit and yogurt smoothies.  Avoid buying frozen meals or prepared deli foods.  Look for foods with live cultures, such as yogurt and kefir. Cooking  Do not add salt to food when cooking. Place a salt shaker on the table and allow each person to add his or her own salt to taste.  Use vegetable protein, such as beans, textured vegetable  protein (TVP), or tofu instead of meat in pasta, casseroles, and soups. Meal planning   Eat less salt, if told by your dietitian. To do this: ? Avoid eating processed or premade food. ? Avoid eating fast food.  Eat less animal protein, including cheese, meat, poultry, or fish, if told by your dietitian. To do this: ? Limit the number of times you have meat, poultry, fish, or cheese each week. Eat a diet free of meat at least 2 days a week. ? Eat only one serving each day of meat, poultry, fish, or seafood. ? When you prepare animal protein, cut pieces into small portion sizes. For most meat and fish, one serving is about the size of one deck of cards.  Eat at least 5 servings of fresh fruits and vegetables each day. To do this: ? Keep fruits and vegetables on hand for snacks. ? Eat 1 piece of fruit or a handful of berries with breakfast. ? Have a salad and fruit at lunch. ? Have two kinds of vegetables at dinner.  Limit foods that are high in a substance called oxalate. These include: ? Spinach. ? Rhubarb. ? Beets. ? Potato chips and french fries. ? Nuts.  If you regularly take a diuretic medicine, make sure to eat at least 1-2 fruits or vegetables high in potassium each day. These include: ? Avocado. ? Banana. ? Orange, prune, carrot, or tomato juice. ? Baked potato. ? Cabbage. ? Beans and split   peas. General instructions   Drink enough fluid to keep your urine clear or pale yellow. This is the most important thing you can do.  Talk to your health care provider and dietitian about taking daily supplements. Depending on your health and the cause of your kidney stones, you may be advised: ? Not to take supplements with vitamin C. ? To take a calcium supplement. ? To take a daily probiotic supplement. ? To take other supplements such as magnesium, fish oil, or vitamin B6.  Take all medicines and supplements as told by your health care provider.  Limit alcohol intake to no  more than 1 drink a day for nonpregnant women and 2 drinks a day for men. One drink equals 12 oz of beer, 5 oz of wine, or 1 oz of hard liquor.  Lose weight if told by your health care provider. Work with your dietitian to find strategies and an eating plan that works best for you. What foods are not recommended? Limit your intake of the following foods, or as told by your dietitian. Talk to your dietitian about specific foods you should avoid based on the type of kidney stones and your overall health. Grains Breads. Bagels. Rolls. Baked goods. Salted crackers. Cereal. Pasta. Vegetables Spinach. Rhubarb. Beets. Canned vegetables. Pickles. Olives. Meats and other protein foods Nuts. Nut butters. Large portions of meat, poultry, or fish. Salted or cured meats. Deli meats. Hot dogs. Sausages. Dairy Cheese. Beverages Regular soft drinks. Regular vegetable juice. Seasonings and other foods Seasoning blends with salt. Salad dressings. Canned soups. Soy sauce. Ketchup. Barbecue sauce. Canned pasta sauce. Casseroles. Pizza. Lasagna. Frozen meals. Potato chips. French fries. Summary  You can reduce your risk of kidney stones by making changes to your diet.  The most important thing you can do is drink enough fluid. You should drink enough fluid to keep your urine clear or pale yellow.  Ask your health care provider or dietitian how much protein from animal sources you should eat each day, and also how much salt and calcium you should have each day. This information is not intended to replace advice given to you by your health care provider. Make sure you discuss any questions you have with your health care provider. Document Revised: 04/05/2019 Document Reviewed: 11/24/2016 Elsevier Patient Education  2020 Elsevier [employer]  

## 2020-12-12 LAB — URINALYSIS, ROUTINE W REFLEX MICROSCOPIC
Bilirubin, UA: NEGATIVE
Glucose, UA: NEGATIVE
Ketones, UA: NEGATIVE
Leukocytes,UA: NEGATIVE
Nitrite, UA: NEGATIVE
Specific Gravity, UA: >1.03 — ABNORMAL HIGH (ref 1.005–1.030)
Urobilinogen, Ur: 0.2 mg/dL (ref 0.2–1.0)
pH, UA: 5 (ref 5.0–7.5)

## 2020-12-12 LAB — MICROSCOPIC EXAMINATION
Bacteria, UA: NONE SEEN
Epithelial Cells (non renal): NONE SEEN /hpf (ref 0–10)
RBC, Urine: >30 /hpf — AB (ref 0–2)
Renal Epithel, UA: NONE SEEN /hpf

## 2021-01-14 ENCOUNTER — Other Ambulatory Visit: Payer: Self-pay | Admitting: Gastroenterology

## 2021-01-14 DIAGNOSIS — K219 Gastro-esophageal reflux disease without esophagitis: Secondary | ICD-10-CM

## 2021-01-14 DIAGNOSIS — R1084 Generalized abdominal pain: Secondary | ICD-10-CM

## 2021-01-14 DIAGNOSIS — K5900 Constipation, unspecified: Secondary | ICD-10-CM

## 2021-08-13 IMAGING — US US RENAL
1 series · 14 of 25 positions shown · IV contrast (agent unspecified)
Comparison: CT abdomen and pelvis with contrast 05/09/2019.
One-view abdomen radiograph 10/10/2020

CLINICAL DATA: Nephrolithiasis. CT abdomen pelvis with
contrast/[DATE]

EXAM:
RENAL / URINARY TRACT ULTRASOUND COMPLETE

[Series 1: us renal · 14 of 46 slices shown]
[im 1/46]
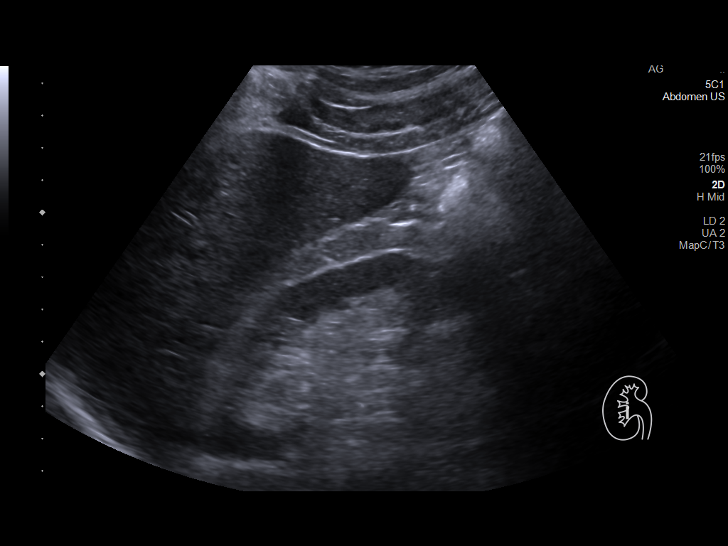
[im 4/46]
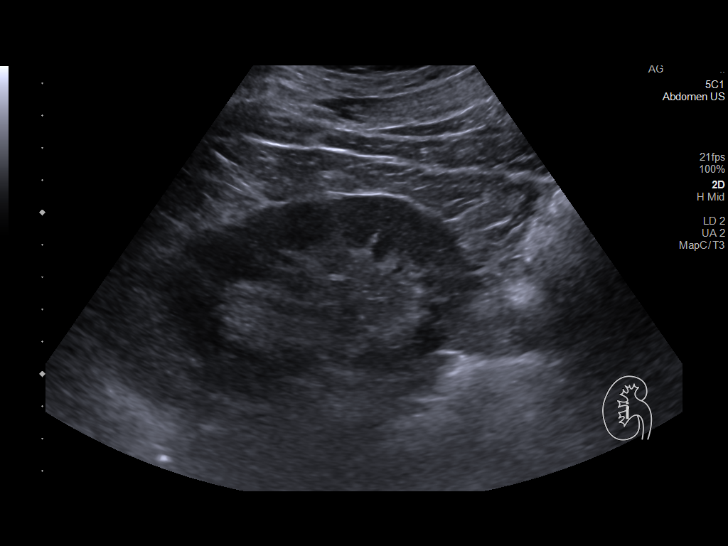
[im 8/46]
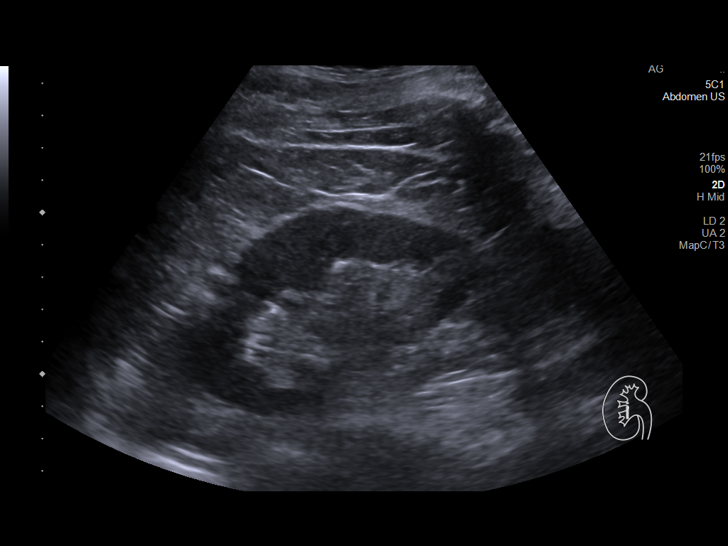
[im 12/46]
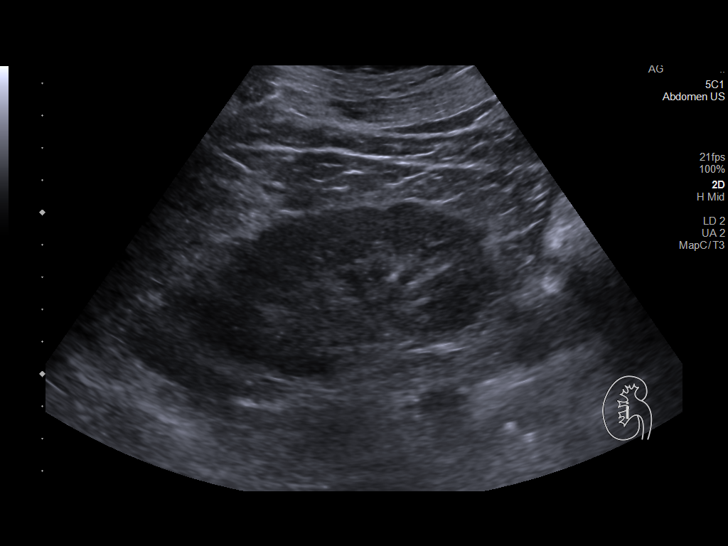
[im 16/46]
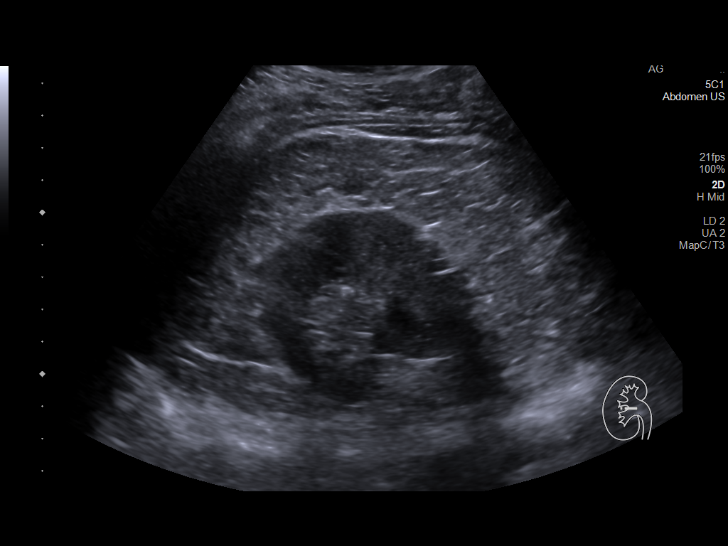
[im 17/46]
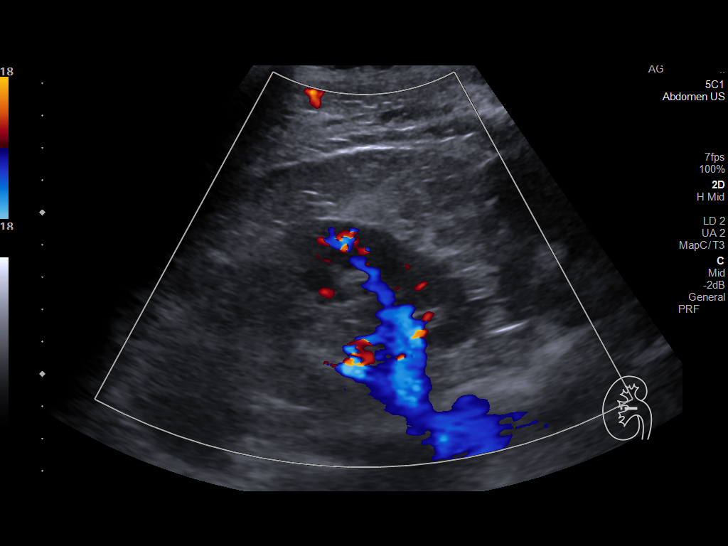
[im 21/46]
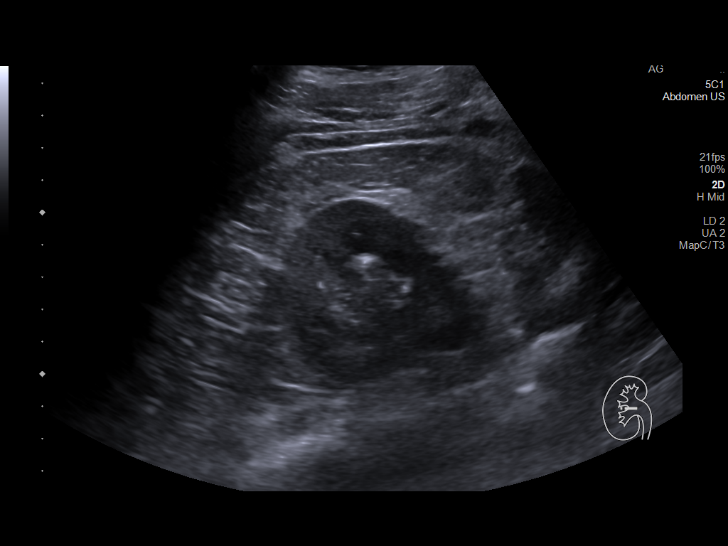
[im 25/46]
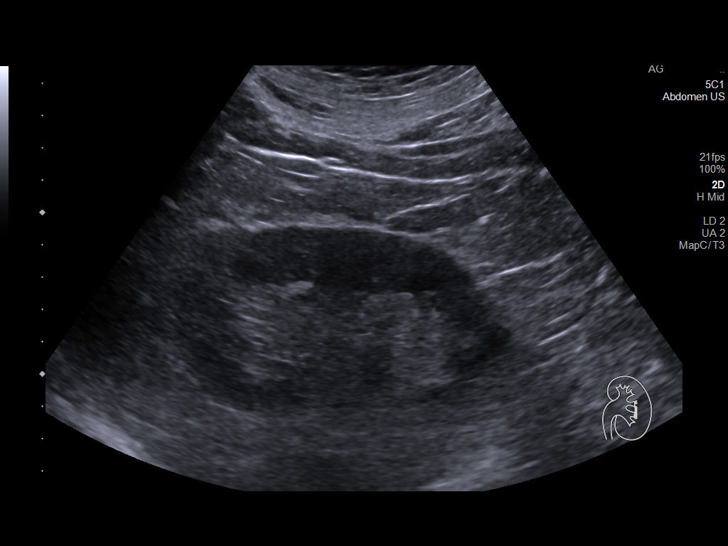
[im 29/46]
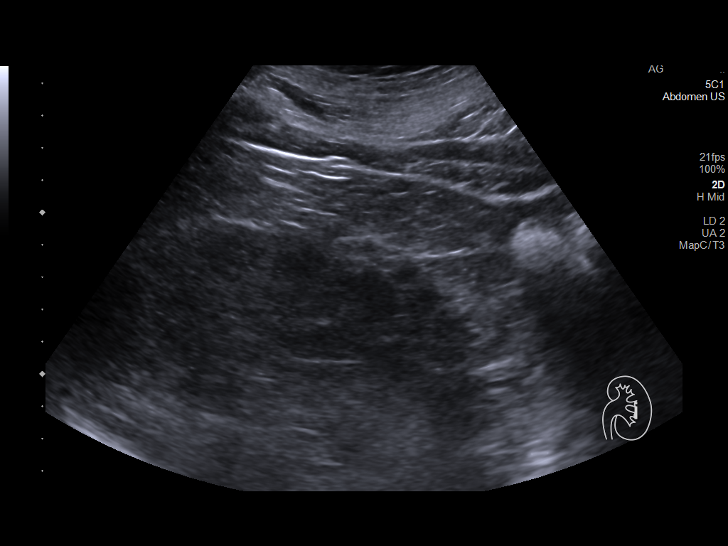
[im 31/46]
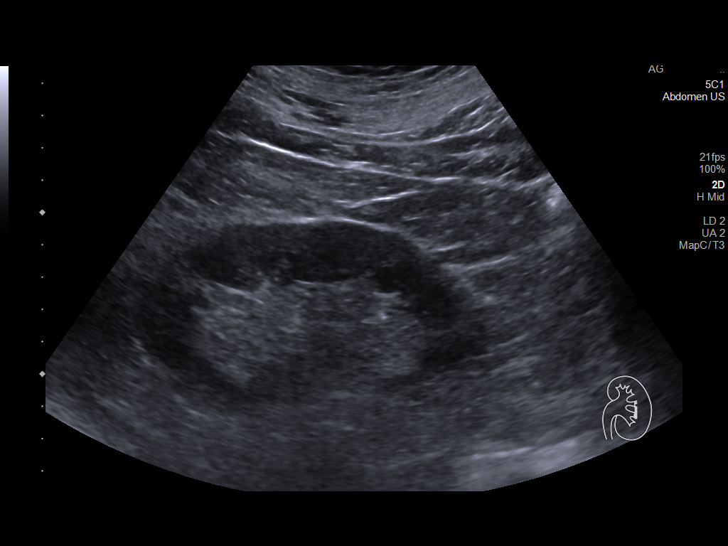
[im 34/46]
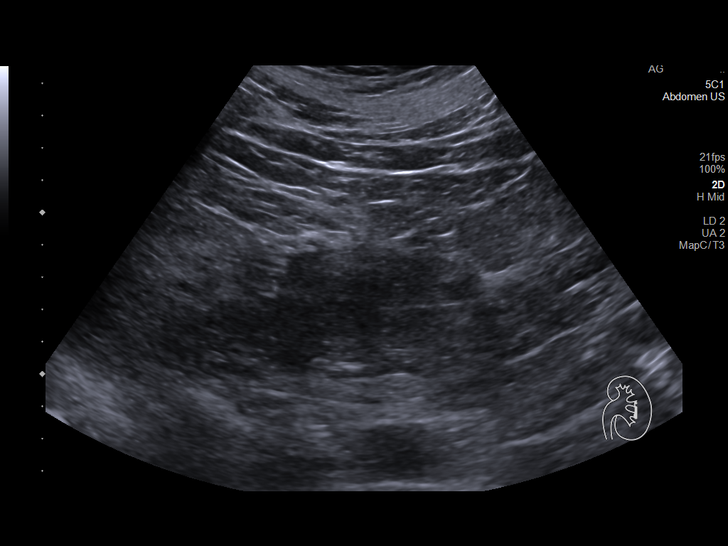
[im 38/46]
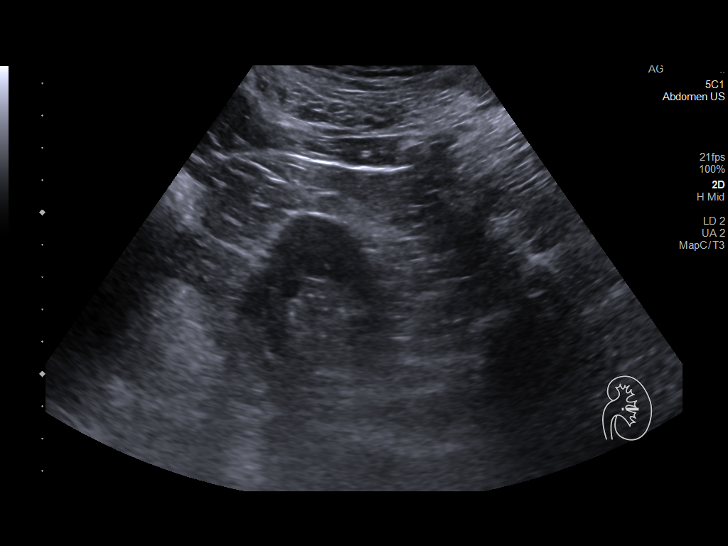
[im 42/46]
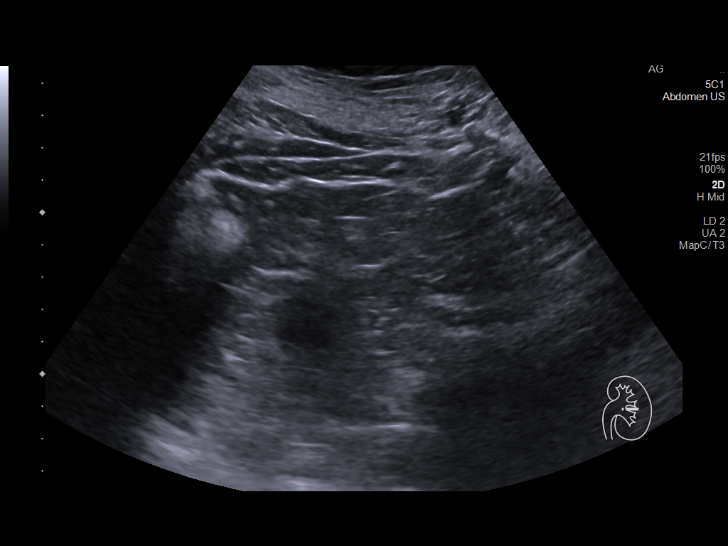
[im 46/46]
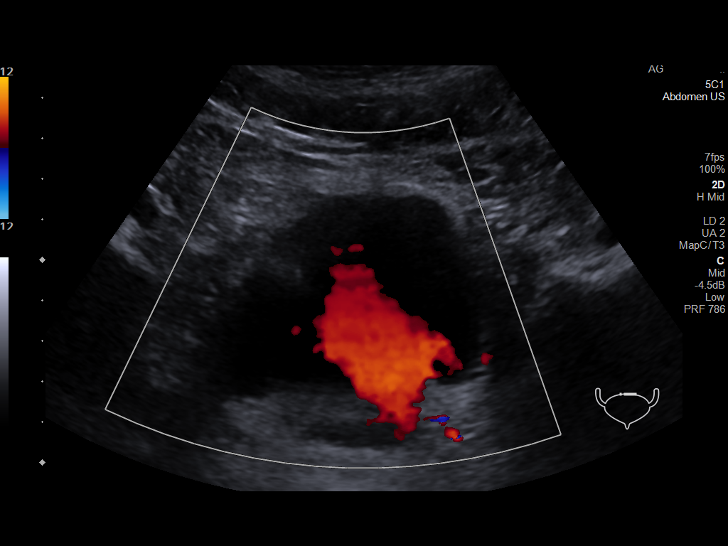

[14 of 25 positions shown; findings below may reference images not displayed]

FINDINGS: Right Kidney:

Renal measurements: 10.0 x 5.3 x 6.0 cm = volume: 165 mL.
Echogenicity within normal limits. A 6 mm shadowing calculus is
present in midportion of the right kidney. Additional stones are
present. There is no hydronephrosis. Renal parenchyma is within
normal limits.

Left Kidney:

Renal measurements: 10.6 x 5.8 x 5.0 cm = volume: 160 mL.
Echogenicity within normal limits. No mass or hydronephrosis
visualized.

Bladder:

Appears normal for degree of bladder distention.

Other:

None.
IMPRESSION: 1. Right-sided nephrolithiasis without evidence for obstruction.
2. Left kidney is within normal limits.

## 2021-11-27 ENCOUNTER — Other Ambulatory Visit: Payer: Self-pay

## 2021-12-02 ENCOUNTER — Telehealth: Payer: Self-pay

## 2021-12-09 NOTE — Telephone Encounter (Signed)
Patient called and made aware. Patient informed to call office back if his symptoms worsen. Patient voiced understanding.

## 2022-10-22 DIAGNOSIS — Z23 Encounter for immunization: Secondary | ICD-10-CM | POA: Diagnosis not present

## 2022-11-25 DIAGNOSIS — E785 Hyperlipidemia, unspecified: Secondary | ICD-10-CM | POA: Diagnosis not present

## 2022-11-25 DIAGNOSIS — Z825 Family history of asthma and other chronic lower respiratory diseases: Secondary | ICD-10-CM | POA: Diagnosis not present

## 2022-11-25 DIAGNOSIS — Z85828 Personal history of other malignant neoplasm of skin: Secondary | ICD-10-CM | POA: Diagnosis not present

## 2022-11-25 DIAGNOSIS — G4700 Insomnia, unspecified: Secondary | ICD-10-CM | POA: Diagnosis not present

## 2022-11-25 DIAGNOSIS — Z85820 Personal history of malignant melanoma of skin: Secondary | ICD-10-CM | POA: Diagnosis not present

## 2023-01-04 DIAGNOSIS — L814 Other melanin hyperpigmentation: Secondary | ICD-10-CM | POA: Diagnosis not present

## 2023-01-04 DIAGNOSIS — C44622 Squamous cell carcinoma of skin of right upper limb, including shoulder: Secondary | ICD-10-CM | POA: Diagnosis not present

## 2023-01-04 DIAGNOSIS — Z85828 Personal history of other malignant neoplasm of skin: Secondary | ICD-10-CM | POA: Diagnosis not present

## 2023-01-04 DIAGNOSIS — L905 Scar conditions and fibrosis of skin: Secondary | ICD-10-CM | POA: Diagnosis not present

## 2023-01-04 DIAGNOSIS — Z85820 Personal history of malignant melanoma of skin: Secondary | ICD-10-CM | POA: Diagnosis not present

## 2023-01-04 DIAGNOSIS — L570 Actinic keratosis: Secondary | ICD-10-CM | POA: Diagnosis not present

## 2023-01-04 DIAGNOSIS — D485 Neoplasm of uncertain behavior of skin: Secondary | ICD-10-CM | POA: Diagnosis not present

## 2023-01-28 DIAGNOSIS — U071 COVID-19: Secondary | ICD-10-CM | POA: Diagnosis not present

## 2023-02-24 DIAGNOSIS — M109 Gout, unspecified: Secondary | ICD-10-CM | POA: Diagnosis not present

## 2023-02-24 DIAGNOSIS — E663 Overweight: Secondary | ICD-10-CM | POA: Diagnosis not present

## 2023-02-24 DIAGNOSIS — R5383 Other fatigue: Secondary | ICD-10-CM | POA: Diagnosis not present

## 2023-02-24 DIAGNOSIS — Z6829 Body mass index (BMI) 29.0-29.9, adult: Secondary | ICD-10-CM | POA: Diagnosis not present

## 2023-02-24 DIAGNOSIS — E7849 Other hyperlipidemia: Secondary | ICD-10-CM | POA: Diagnosis not present

## 2023-02-24 DIAGNOSIS — U071 COVID-19: Secondary | ICD-10-CM | POA: Diagnosis not present

## 2023-02-24 DIAGNOSIS — E782 Mixed hyperlipidemia: Secondary | ICD-10-CM | POA: Diagnosis not present

## 2023-02-24 DIAGNOSIS — E119 Type 2 diabetes mellitus without complications: Secondary | ICD-10-CM | POA: Diagnosis not present

## 2023-02-24 DIAGNOSIS — R7309 Other abnormal glucose: Secondary | ICD-10-CM | POA: Diagnosis not present

## 2023-02-24 DIAGNOSIS — U099 Post covid-19 condition, unspecified: Secondary | ICD-10-CM | POA: Diagnosis not present

## 2023-02-24 DIAGNOSIS — F419 Anxiety disorder, unspecified: Secondary | ICD-10-CM | POA: Diagnosis not present

## 2023-02-24 DIAGNOSIS — I10 Essential (primary) hypertension: Secondary | ICD-10-CM | POA: Diagnosis not present

## 2023-07-27 DIAGNOSIS — E785 Hyperlipidemia, unspecified: Secondary | ICD-10-CM | POA: Diagnosis not present

## 2023-07-27 DIAGNOSIS — Z809 Family history of malignant neoplasm, unspecified: Secondary | ICD-10-CM | POA: Diagnosis not present

## 2023-07-27 DIAGNOSIS — N1831 Chronic kidney disease, stage 3a: Secondary | ICD-10-CM | POA: Diagnosis not present

## 2023-07-27 DIAGNOSIS — Z8249 Family history of ischemic heart disease and other diseases of the circulatory system: Secondary | ICD-10-CM | POA: Diagnosis not present

## 2023-07-27 DIAGNOSIS — Z823 Family history of stroke: Secondary | ICD-10-CM | POA: Diagnosis not present

## 2023-07-27 DIAGNOSIS — Z85828 Personal history of other malignant neoplasm of skin: Secondary | ICD-10-CM | POA: Diagnosis not present

## 2023-07-27 DIAGNOSIS — D039 Melanoma in situ, unspecified: Secondary | ICD-10-CM | POA: Diagnosis not present

## 2023-07-27 DIAGNOSIS — F419 Anxiety disorder, unspecified: Secondary | ICD-10-CM | POA: Diagnosis not present

## 2023-07-27 DIAGNOSIS — Z833 Family history of diabetes mellitus: Secondary | ICD-10-CM | POA: Diagnosis not present

## 2023-08-09 DIAGNOSIS — R5383 Other fatigue: Secondary | ICD-10-CM | POA: Diagnosis not present

## 2023-08-09 DIAGNOSIS — I10 Essential (primary) hypertension: Secondary | ICD-10-CM | POA: Diagnosis not present

## 2023-08-09 DIAGNOSIS — F419 Anxiety disorder, unspecified: Secondary | ICD-10-CM | POA: Diagnosis not present

## 2023-08-09 DIAGNOSIS — E782 Mixed hyperlipidemia: Secondary | ICD-10-CM | POA: Diagnosis not present

## 2023-08-10 DIAGNOSIS — E559 Vitamin D deficiency, unspecified: Secondary | ICD-10-CM | POA: Diagnosis not present

## 2023-08-10 DIAGNOSIS — I10 Essential (primary) hypertension: Secondary | ICD-10-CM | POA: Diagnosis not present

## 2023-08-10 DIAGNOSIS — E782 Mixed hyperlipidemia: Secondary | ICD-10-CM | POA: Diagnosis not present

## 2023-08-19 ENCOUNTER — Ambulatory Visit (INDEPENDENT_AMBULATORY_CARE_PROVIDER_SITE_OTHER): Payer: 59

## 2023-08-19 ENCOUNTER — Encounter: Payer: Self-pay | Admitting: Podiatry

## 2023-08-19 ENCOUNTER — Ambulatory Visit: Payer: 59 | Admitting: Podiatry

## 2023-08-19 DIAGNOSIS — M722 Plantar fascial fibromatosis: Secondary | ICD-10-CM

## 2023-08-19 DIAGNOSIS — M778 Other enthesopathies, not elsewhere classified: Secondary | ICD-10-CM

## 2023-08-19 NOTE — Progress Notes (Signed)
  Subjective:  Patient ID: Travis Warner, male    DOB: October 14, 1961,  MRN: [id number]  Chief Complaint  Patient presents with   Foot Pain    RM1: patient is here for foot pain    62 y.o. male presents with the above complaint. History confirmed with patient.  He has a hard lump in the middle of the right arch that is occasionally painful  Objective:  Physical Exam: warm, good capillary refill, no trophic changes or ulcerative lesions, normal DP and PT pulses, normal sensory exam, and palpable medial band plantar fibroma, small in size and minimally tender.   Radiographs: Multiple views x-ray of the right foot: no fracture, dislocation, swelling or degenerative changes noted Assessment:   1. Plantar fibromatosis      Plan:  Patient was evaluated and treated and all questions answered.  We discussed etiology and treatment options of plantar fibromas in detail.  We discussed monitoring them, injection therapy with corticosteroids as well as surgical excision.  Currently this is minimally bothersome for him and he would like to continue to monitor.  He will let me know if this worsens or does not improve then would like an injection.  Return as needed. Return if symptoms worsen or fail to improve.

## 2023-08-23 ENCOUNTER — Ambulatory Visit: Payer: Self-pay | Admitting: Podiatry

## 2023-12-08 DIAGNOSIS — Z85828 Personal history of other malignant neoplasm of skin: Secondary | ICD-10-CM | POA: Diagnosis not present

## 2023-12-08 DIAGNOSIS — D225 Melanocytic nevi of trunk: Secondary | ICD-10-CM | POA: Diagnosis not present

## 2023-12-08 DIAGNOSIS — D0461 Carcinoma in situ of skin of right upper limb, including shoulder: Secondary | ICD-10-CM | POA: Diagnosis not present

## 2023-12-08 DIAGNOSIS — L570 Actinic keratosis: Secondary | ICD-10-CM | POA: Diagnosis not present

## 2023-12-08 DIAGNOSIS — L821 Other seborrheic keratosis: Secondary | ICD-10-CM | POA: Diagnosis not present

## 2023-12-08 DIAGNOSIS — L814 Other melanin hyperpigmentation: Secondary | ICD-10-CM | POA: Diagnosis not present

## 2023-12-10 DIAGNOSIS — M542 Cervicalgia: Secondary | ICD-10-CM | POA: Diagnosis not present

## 2023-12-10 DIAGNOSIS — E6609 Other obesity due to excess calories: Secondary | ICD-10-CM | POA: Diagnosis not present

## 2023-12-10 DIAGNOSIS — Z6829 Body mass index (BMI) 29.0-29.9, adult: Secondary | ICD-10-CM | POA: Diagnosis not present

## 2023-12-10 DIAGNOSIS — M25511 Pain in right shoulder: Secondary | ICD-10-CM | POA: Diagnosis not present

## 2023-12-28 ENCOUNTER — Other Ambulatory Visit (HOSPITAL_COMMUNITY): Payer: Self-pay | Admitting: Family Medicine

## 2023-12-28 DIAGNOSIS — R52 Pain, unspecified: Secondary | ICD-10-CM

## 2023-12-31 ENCOUNTER — Ambulatory Visit (HOSPITAL_COMMUNITY)
Admission: RE | Admit: 2023-12-31 | Discharge: 2023-12-31 | Disposition: A | Discharge status: Home / Self Care | Payer: 59 | Source: Ambulatory Visit | Attending: Family Medicine | Admitting: Family Medicine

## 2023-12-31 DIAGNOSIS — R52 Pain, unspecified: Secondary | ICD-10-CM | POA: Diagnosis not present

## 2023-12-31 DIAGNOSIS — M25511 Pain in right shoulder: Secondary | ICD-10-CM | POA: Diagnosis not present

## 2023-12-31 DIAGNOSIS — M542 Cervicalgia: Secondary | ICD-10-CM | POA: Diagnosis not present

## 2024-04-19 ENCOUNTER — Ambulatory Visit
Admission: RE | Admit: 2024-04-19 | Discharge: 2024-04-19 | Disposition: A | Discharge status: Home / Self Care | Source: Ambulatory Visit | Attending: Family Medicine | Admitting: Family Medicine

## 2024-04-19 VITALS — BP 168/95 | HR 67 | Temp 98.6°F | Resp 18

## 2024-04-19 DIAGNOSIS — H6121 Impacted cerumen, right ear: Secondary | ICD-10-CM | POA: Diagnosis not present

## 2024-04-19 NOTE — ED Triage Notes (Signed)
 Pt reports right ear pain, difficulty hearing out of the ear x 3 days.

## 2024-04-19 NOTE — ED Provider Notes (Signed)
 [facility]    CSN: [id number] Arrival date & time: 04/19/24  1151      History   Chief Complaint Chief Complaint  Patient presents with   Ear Fullness    Entered by patient    HPI Travis Warner is a 63 y.o. male.   Patient presenting today with 3-day history of right sided ear discomfort, muffled hearing, pressure.  Denies drainage, bleeding, fever, chills, sharp stabbing pains, nausea, vomiting.  So far not trying anything over-the-counter for symptoms.  No past history of similar.    Past Medical History:  Diagnosis Date   Cancer ([facility])    skin cancer- basal cell on face   Chronic kidney disease    kidney stone   Hypercholesterolemia    Hypertension    Insomnia     Patient Active Problem List   Diagnosis Date Noted   Kidney stones 10/10/2020   History of colonic polyps 02/05/2020   Constipation 04/17/2019   Abdominal pain 04/17/2019   GERD (gastroesophageal reflux disease) 04/17/2019   FOOT PAIN, RIGHT 03/07/2009    Past Surgical History:  Procedure Laterality Date   COLONOSCOPY  02/24/2012   Procedure: COLONOSCOPY;  Surgeon: Suzette Espy, MD;  [facility];  Service: Endoscopy;  Laterality: N/A;  11:30 AM   COLONOSCOPY N/A 04/22/2017   Procedure: COLONOSCOPY;  Surgeon: Suzette Espy, MD;  2 sessile serrated polyp/adenomas with high grade dysplasia. Due for repeat in 2021.    COLONOSCOPY WITH PROPOFOL  N/A 04/01/2020   Procedure: COLONOSCOPY WITH PROPOFOL ;  Surgeon: Suzette Espy, MD;  [facility];  Service: Endoscopy;  Laterality: N/A;  2:00pm - pt knows to arrive at 8:30   CYSTOSCOPY WITH RETROGRADE PYELOGRAM, URETEROSCOPY AND STENT PLACEMENT Left 10/14/2020   Procedure: CYSTOSCOPY WITH RETROGRADE PYELOGRAM, DIAGNOSTIC URETEROSCOPY AND STENT PLACEMENT;  Surgeon: Marco Severs, MD;  Location[facility];  Service: Urology;  Laterality: Left;   CYSTOSCOPY WITH RETROGRADE PYELOGRAM, URETEROSCOPY AND STENT PLACEMENT Left  10/31/2020   Procedure: CYSTOSCOPY WITH RETROGRADE PYELOGRAM, URETEROSCOPY AND STENT EXCHANGE;  Surgeon: Marco Severs, MD;  Location[facility];  Service: Urology;  Laterality: Left;   HOLMIUM LASER APPLICATION Left 10/31/2020   Procedure: HOLMIUM LASER APPLICATION;  Surgeon: Marco Severs, MD;  Location[facility];  Service: Urology;  Laterality: Left;   POLYPECTOMY  04/22/2017   Procedure: POLYPECTOMY;  Surgeon: Suzette Espy, MD;  [facility];  Service: Endoscopy;;  colon   POLYPECTOMY  04/01/2020   Procedure: POLYPECTOMY;  Surgeon: Suzette Espy, MD;  [facility];  Service: Endoscopy;;   Skin cancer removed from face         Home Medications    Prior to Admission medications   Medication Sig Start Date End Date Taking? Authorizing Provider  ALPRAZolam (XANAX) 1 MG tablet Take 1 mg by mouth daily as needed for anxiety.  02/17/19   [provider]  benzonatate  (TESSALON ) 100 MG capsule Take 1 capsule (100 mg total) by mouth every 8 (eight) hours. 11/10/20   Wurst, [place], PA-C  cetirizine -pseudoephedrine  (ZYRTEC -D) 5-120 MG tablet Take 1 tablet by mouth daily. 11/10/20   Wurst, [place], PA-C  olmesartan (BENICAR) 20 MG tablet Take 20 mg by mouth daily.  03/08/20   [provider]  omeprazole  (PRILOSEC) 40 MG capsule TAKE 1 CAPSULE(40 MG) BY MOUTH DAILY BEFORE BREAKFAST 01/14/21   Lanney Pitts, PA-C  oxyCODONE -acetaminophen  (PERCOCET) 5-325 MG tablet Take 1 tablet  by mouth every 4 (four) hours as needed. 10/31/20   McKenzie, Arden Beck, MD  rosuvastatin (CRESTOR) 20 MG tablet Take 20 mg by mouth at bedtime. 03/01/20   [provider]  tamsulosin  (FLOMAX ) 0.4 MG CAPS capsule Take 1 capsule (0.4 mg total) by mouth daily. 10/31/20   McKenzie, Arden Beck, MD    Family History Family History  Problem Relation Age of Onset   COPD Mother    Liver cancer Father    Colon cancer Neg Hx     Social History Social History   Tobacco Use    Smoking status: Never   Smokeless tobacco: Never  Vaping Use   Vaping status: Never Used  Substance Use Topics   Alcohol use: No   Drug use: No     Allergies   Patient has no known allergies.   Review of Systems Review of Systems Per HPI  Physical Exam Triage Vital Signs ED Triage Vitals  Encounter Vitals Group     BP 04/19/24 1238 (!) 168/95     Systolic BP Percentile --      Diastolic BP Percentile --      Pulse Rate 04/19/24 1238 67     Resp 04/19/24 1238 18     Temp 04/19/24 1238 98.6 F (37 C)     Temp Source 04/19/24 1238 Oral     SpO2 04/19/24 1238 97 %     Weight --      Height --      Head Circumference --      Peak Flow --      Pain Score 04/19/24 1242 5     Pain Loc --      Pain Education --      Exclude from Growth Chart --    No data found.  Updated Vital Signs BP (!) 168/95 (BP Location: Right Arm)   Pulse 67   Temp 98.6 F (37 C) (Oral)   Resp 18   SpO2 97%   Visual Acuity Right Eye Distance:   Left Eye Distance:   Bilateral Distance:    Right Eye Near:   Left Eye Near:    Bilateral Near:     Physical Exam Vitals and nursing note reviewed.  Constitutional:      Appearance: Normal appearance.  HENT:     Head: Atraumatic.     Right Ear: There is impacted cerumen.     Left Ear: Tympanic membrane normal.     Nose: Nose normal.  Eyes:     Extraocular Movements: Extraocular movements intact.     Conjunctiva/sclera: Conjunctivae normal.  Cardiovascular:     Rate and Rhythm: Normal rate.  Pulmonary:     Effort: Pulmonary effort is normal.  Musculoskeletal:        General: Normal range of motion.     Cervical back: Normal range of motion and neck supple.  Skin:    General: Skin is warm and dry.  Neurological:     Mental Status: He is oriented to person, place, and time.     Motor: No weakness.     Gait: Gait normal.  Psychiatric:        Mood and Affect: Mood normal.        Thought Content: Thought content normal.         Judgment: Judgment normal.      UC Treatments / Results  Labs (all labs ordered are listed, but only abnormal results are displayed) Labs Reviewed - No  data to display  EKG   Radiology No results found.  Procedures Procedures (including critical care time)  Medications Ordered in UC Medications - No data to display  Initial Impression / Assessment and Plan / UC Course  I have reviewed the triage vital signs and the nursing notes.  Pertinent labs & imaging results that were available during my care of the patient were reviewed by me and considered in my medical decision making (see chart for details).     Ear lavage performed with warm water  and peroxide today with full clearance of cerumen impaction to the right ear.  TM visualized and benign post procedure, patient tolerated procedure well with no immediate complications.  Return for worsening symptoms.  Final Clinical Impressions(s) / UC Diagnoses   Final diagnoses:  Impacted cerumen of right ear   Discharge Instructions   None    ED Prescriptions   None    PDMP not reviewed this encounter.   Corbin Dess, [place] 04/19/24 1307

## 2024-06-20 DIAGNOSIS — E559 Vitamin D deficiency, unspecified: Secondary | ICD-10-CM | POA: Diagnosis not present

## 2024-06-20 DIAGNOSIS — Z6827 Body mass index (BMI) 27.0-27.9, adult: Secondary | ICD-10-CM | POA: Diagnosis not present

## 2024-06-20 DIAGNOSIS — E663 Overweight: Secondary | ICD-10-CM | POA: Diagnosis not present

## 2024-06-20 DIAGNOSIS — I10 Essential (primary) hypertension: Secondary | ICD-10-CM | POA: Diagnosis not present

## 2024-06-20 DIAGNOSIS — E782 Mixed hyperlipidemia: Secondary | ICD-10-CM | POA: Diagnosis not present

## 2024-06-20 DIAGNOSIS — R7309 Other abnormal glucose: Secondary | ICD-10-CM | POA: Diagnosis not present

## 2024-06-21 DIAGNOSIS — I10 Essential (primary) hypertension: Secondary | ICD-10-CM | POA: Diagnosis not present

## 2024-06-21 DIAGNOSIS — E559 Vitamin D deficiency, unspecified: Secondary | ICD-10-CM | POA: Diagnosis not present

## 2024-06-21 DIAGNOSIS — E782 Mixed hyperlipidemia: Secondary | ICD-10-CM | POA: Diagnosis not present

## 2024-07-18 ENCOUNTER — Ambulatory Visit: Admitting: Orthopaedic Surgery

## 2024-08-07 ENCOUNTER — Ambulatory Visit: Admitting: Physician Assistant

## 2024-08-07 DIAGNOSIS — R29898 Other symptoms and signs involving the musculoskeletal system: Secondary | ICD-10-CM | POA: Diagnosis not present

## 2024-08-09 DIAGNOSIS — D044 Carcinoma in situ of skin of scalp and neck: Secondary | ICD-10-CM | POA: Diagnosis not present

## 2024-08-24 ENCOUNTER — Ambulatory Visit: Admitting: Orthopaedic Surgery

## 2024-08-29 ENCOUNTER — Ambulatory Visit: Admitting: Orthopaedic Surgery

## 2024-08-29 ENCOUNTER — Other Ambulatory Visit (INDEPENDENT_AMBULATORY_CARE_PROVIDER_SITE_OTHER)

## 2024-08-29 DIAGNOSIS — M25511 Pain in right shoulder: Secondary | ICD-10-CM

## 2024-08-29 DIAGNOSIS — G8929 Other chronic pain: Secondary | ICD-10-CM

## 2024-08-29 NOTE — Progress Notes (Signed)
 Office Visit Note   Patient: Travis Warner           Date of Birth: November 25, 1961           MRN: [id number] Visit Date: 08/29/2024              Requested by: Marvine Rush, MD [address] [place],  [place] [address] PCP: Marvine Rush, MD   Assessment & Plan: Visit Diagnoses:  1. Chronic right shoulder pain     Plan: History of Present Illness Travis Warner is a 63 year old male who presents with right shoulder pain for evaluation.  He has experienced right shoulder pain for ten months without known injury. The pain is primarily throughout the shoulder and worsens with certain movements, especially when raising the arm. It is sometimes severe enough to prevent arm elevation and disrupts sleep when lying on the shoulder. Occasionally, he can raise his arm with minimal pain, but specific movements cause significant discomfort.  A few months ago, the pain subsided completely for one to two weeks before returning. Medication for gout previously alleviated the shoulder pain. He reports no diabetes or other significant medical conditions.  Physical Exam MUSCULOSKELETAL: Right shoulder forward flexion normal with slight pain at top. External rotation to 45 degrees. Internal rotation normal. Abduction greater than 100 degrees.  Minimal pain with Hawkins impingement test. Right infraspinatus muscle strength intact. Right shoulder muscle strength intact. Pain with supraspinatus test. Right AC joint non-tender.  Assessment and Plan Right shoulder rotator cuff tendinitis Chronic supraspinatus tendinitis with manageable symptoms. X-rays show age-appropriate changes. No severe pain. - Consider anti-inflammatory medication if discomfort increases. - Consider short course of oral steroids if symptoms worsen. - Consider cortisone injection if pain becomes severe, advised against unless necessary. - Advised to contact office if symptoms worsen.  Follow-Up Instructions: No follow-ups  on file.   Orders:  Orders Placed This Encounter  Procedures   XR Shoulder Right   No orders of the defined types were placed in this encounter.   Review of Systems  Constitutional: Negative.   HENT: Negative.    Eyes: Negative.   Respiratory: Negative.    Cardiovascular: Negative.   Gastrointestinal: Negative.   Endocrine: Negative.   Genitourinary: Negative.   Skin: Negative.   Allergic/Immunologic: Negative.   Neurological: Negative.   Hematological: Negative.   Psychiatric/Behavioral: Negative.    All other systems reviewed and are negative.    Objective: Vital Signs: There were no vitals taken for this visit.  Physical Exam Vitals and nursing note reviewed.  Constitutional:      Appearance: He is well-developed.  HENT:     Head: Normocephalic and atraumatic.  Eyes:     Pupils: Pupils are equal, round, and reactive to light.  Pulmonary:     Effort: Pulmonary effort is normal.  Abdominal:     Palpations: Abdomen is soft.  Musculoskeletal:        General: Normal range of motion.     Cervical back: Neck supple.  Skin:    General: Skin is warm.  Neurological:     Mental Status: He is alert and oriented to person, place, and time.  Psychiatric:        Behavior: Behavior normal.        Thought Content: Thought content normal.        Judgment: Judgment normal.     Ortho Exam  Specialty Comments:  No specialty comments available.  Imaging: XR Shoulder Right Result  Date: 08/29/2024 X-rays of the right shoulder show degenerative spurring of the [facility] joint.  No acute abnormalities.    PMFS History: Patient Active Problem List   Diagnosis Date Noted   Kidney stones 10/10/2020   History of colonic polyps 02/05/2020   Constipation 04/17/2019   Abdominal pain 04/17/2019   GERD (gastroesophageal reflux disease) 04/17/2019   FOOT PAIN, RIGHT 03/07/2009   Past Medical History:  Diagnosis Date   Cancer ([facility]CC)    skin cancer- basal cell on face   Chronic  kidney disease    kidney stone   Hypercholesterolemia    Hypertension    Insomnia     Family History  Problem Relation Age of Onset   COPD Mother    Liver cancer Father    Colon cancer Neg Hx     Past Surgical History:  Procedure Laterality Date   COLONOSCOPY  02/24/2012   Procedure: COLONOSCOPY;  Surgeon: Lamar CHRISTELLA Hollingshead, MD;  [facility];  Service: Endoscopy;  Laterality: N/A;  11:30 AM   COLONOSCOPY N/A 04/22/2017   Procedure: COLONOSCOPY;  Surgeon: Lamar CHRISTELLA Hollingshead, MD;  2 sessile serrated polyp/adenomas with high grade dysplasia. Due for repeat in 2021.    COLONOSCOPY WITH PROPOFOL  N/A 04/01/2020   Procedure: COLONOSCOPY WITH PROPOFOL ;  Surgeon: Hollingshead Lamar CHRISTELLA, MD;  [facility];  Service: Endoscopy;  Laterality: N/A;  2:00pm - pt knows to arrive at 8:30   CYSTOSCOPY WITH RETROGRADE PYELOGRAM, URETEROSCOPY AND STENT PLACEMENT Left 10/14/2020   Procedure: CYSTOSCOPY WITH RETROGRADE PYELOGRAM, DIAGNOSTIC URETEROSCOPY AND STENT PLACEMENT;  Surgeon: Sherrilee Belvie CROME, MD;  [facility];  Service: Urology;  Laterality: Left;   CYSTOSCOPY WITH RETROGRADE PYELOGRAM, URETEROSCOPY AND STENT PLACEMENT Left 10/31/2020   Procedure: CYSTOSCOPY WITH RETROGRADE PYELOGRAM, URETEROSCOPY AND STENT EXCHANGE;  Surgeon: Sherrilee Belvie CROME, MD;  [facility];  Service: Urology;  Laterality: Left;   HOLMIUM LASER APPLICATION Left 10/31/2020   Procedure: HOLMIUM LASER APPLICATION;  Surgeon: Sherrilee Belvie CROME, MD;  [facility];  Service: Urology;  Laterality: Left;   POLYPECTOMY  04/22/2017   Procedure: POLYPECTOMY;  Surgeon: Lamar CHRISTELLA Hollingshead, MD;  [facility];  Service: Endoscopy;;  colon   POLYPECTOMY  04/01/2020   Procedure: POLYPECTOMY;  Surgeon: Hollingshead Lamar CHRISTELLA, MD;  [facility];  Service: Endoscopy;;   Skin cancer removed from face     Social History   Occupational History   Not on file  Tobacco Use   Smoking status: Never   Smokeless  tobacco: Never  Vaping Use   Vaping status: Never Used  Substance and Sexual Activity   Alcohol use: No   Drug use: No   Sexual activity: Yes

## 2024-09-05 ENCOUNTER — Ambulatory Visit: Admitting: Orthopaedic Surgery

## 2024-09-21 ENCOUNTER — Ambulatory Visit: Admitting: Orthopaedic Surgery

## 2024-09-21 DIAGNOSIS — M25511 Pain in right shoulder: Secondary | ICD-10-CM

## 2024-09-21 DIAGNOSIS — G8929 Other chronic pain: Secondary | ICD-10-CM | POA: Diagnosis not present

## 2024-09-21 MED ORDER — LIDOCAINE HCL 1 % IJ SOLN
3.0000 mL | INTRAMUSCULAR | Status: AC | PRN
  Administered 2024-09-21: 3 mL

## 2024-09-21 MED ORDER — BUPIVACAINE HCL 0.5 % IJ SOLN
3.0000 mL | INTRAMUSCULAR | Status: AC | PRN
  Administered 2024-09-21: 3 mL via INTRA_ARTICULAR

## 2024-09-21 MED ORDER — METHYLPREDNISOLONE ACETATE 40 MG/ML IJ SUSP
40.0000 mg | INTRAMUSCULAR | Status: AC | PRN
  Administered 2024-09-21: 40 mg via INTRA_ARTICULAR

## 2024-09-21 NOTE — Progress Notes (Signed)
 Office Visit Note   Patient: Travis Warner           Date of Birth: [birth year]           MRN: [id number] Visit Date: 09/21/2024              Requested by: Marvine Rush, MD [address] [place],  [place] [address] PCP: Marvine Rush, MD   Assessment & Plan: Visit Diagnoses:  1. Chronic right shoulder pain     Plan: History of Present Illness Kalieb K Warner Keith is a 63 year old male who presents with right shoulder pain.  He has experienced persistent right shoulder pain for several weeks. The pain is constant and has recently increased in intensity, described as severe and affecting daily activities. He has not taken any previously prescribed medications, including steroids and anti-inflammatory drugs.  Right shoulder exam is unchanged from prior visit.  Assessment and Plan Right shoulder rotator cuff tendinitis Chronic tendinitis with persistent pain. Prefers corticosteroid injection. - Administer subacromial corticosteroid injection to the right shoulder.  Follow-Up Instructions: No follow-ups on file.   Orders:  No orders of the defined types were placed in this encounter.  No orders of the defined types were placed in this encounter.     Procedures: Large Joint Inj: R subacromial bursa on 09/21/2024 4:30 PM Indications: pain Details: 22 G needle  Arthrogram: No  Medications: 3 mL lidocaine  1 %; 3 mL bupivacaine  0.5 %; 40 mg methylPREDNISolone  acetate 40 MG/ML Outcome: tolerated well, no immediate complications Consent was given by the patient. Patient was prepped and draped in the usual sterile fashion.       Clinical Data: No additional findings.   Subjective: Chief Complaint  Patient presents with   Right Shoulder - Pain    HPI  Review of Systems  Constitutional: Negative.   HENT: Negative.    Eyes: Negative.   Respiratory: Negative.    Cardiovascular: Negative.   Gastrointestinal: Negative.   Endocrine: Negative.    Genitourinary: Negative.   Skin: Negative.   Allergic/Immunologic: Negative.   Neurological: Negative.   Hematological: Negative.   Psychiatric/Behavioral: Negative.    All other systems reviewed and are negative.    Objective: Vital Signs: There were no vitals taken for this visit.  Physical Exam Vitals and nursing note reviewed.  Constitutional:      Appearance: He is well-developed.  Pulmonary:     Effort: Pulmonary effort is normal.  Abdominal:     Palpations: Abdomen is soft.  Skin:    General: Skin is warm.  Neurological:     Mental Status: He is alert and oriented to person, place, and time.  Psychiatric:        Behavior: Behavior normal.        Thought Content: Thought content normal.        Judgment: Judgment normal.     Ortho Exam  Specialty Comments:  No specialty comments available.  Imaging: No results found.   PMFS History: Patient Active Problem List   Diagnosis Date Noted   Kidney stones 10/10/2020   History of colonic polyps 02/05/2020   Constipation 04/17/2019   Abdominal pain 04/17/2019   GERD (gastroesophageal reflux disease) 04/17/2019   FOOT PAIN, RIGHT 03/07/2009   Past Medical History:  Diagnosis Date   Cancer (HCC)    skin cancer- basal cell on face   Chronic kidney disease    kidney stone   Hypercholesterolemia    Hypertension  Insomnia     Family History  Problem Relation Age of Onset   COPD Mother    Liver cancer Father    Colon cancer Neg Hx     Past Surgical History:  Procedure Laterality Date   COLONOSCOPY  02/24/2012   Procedure: COLONOSCOPY;  Surgeon: Lamar CHRISTELLA Hollingshead, MD;  [facility];  Service: Endoscopy;  Laterality: N/A;  11:30 AM   COLONOSCOPY N/A 04/22/2017   Procedure: COLONOSCOPY;  Surgeon: Lamar CHRISTELLA Hollingshead, MD;  2 sessile serrated polyp/adenomas with high grade dysplasia. Due for repeat in 2021.    COLONOSCOPY WITH PROPOFOL  N/A 04/01/2020   Procedure: COLONOSCOPY WITH PROPOFOL ;  Surgeon: Hollingshead Lamar CHRISTELLA, MD;  [facility];  Service: Endoscopy;  Laterality: N/A;  2:00pm - pt knows to arrive at 8:30   CYSTOSCOPY WITH RETROGRADE PYELOGRAM, URETEROSCOPY AND STENT PLACEMENT Left 10/14/2020   Procedure: CYSTOSCOPY WITH RETROGRADE PYELOGRAM, DIAGNOSTIC URETEROSCOPY AND STENT PLACEMENT;  Surgeon: Sherrilee Belvie CROME, MD;  Location: [facility];  Service: Urology;  Laterality: Left;   CYSTOSCOPY WITH RETROGRADE PYELOGRAM, URETEROSCOPY AND STENT PLACEMENT Left 10/31/2020   Procedure: CYSTOSCOPY WITH RETROGRADE PYELOGRAM, URETEROSCOPY AND STENT EXCHANGE;  Surgeon: Sherrilee Belvie CROME, MD;  Location: [facility];  Service: Urology;  Laterality: Left;   HOLMIUM LASER APPLICATION Left 10/31/2020   Procedure: HOLMIUM LASER APPLICATION;  Surgeon: Sherrilee Belvie CROME, MD;  Location: [facility];  Service: Urology;  Laterality: Left;   POLYPECTOMY  04/22/2017   Procedure: POLYPECTOMY;  Surgeon: Lamar CHRISTELLA Hollingshead, MD;  [facility];  Service: Endoscopy;;  colon   POLYPECTOMY  04/01/2020   Procedure: POLYPECTOMY;  Surgeon: Hollingshead Lamar CHRISTELLA, MD;  [facility];  Service: Endoscopy;;   Skin cancer removed from face     Social History   Occupational History   Not on file  Tobacco Use   Smoking status: Never   Smokeless tobacco: Never  Vaping Use   Vaping status: Never Used  Substance and Sexual Activity   Alcohol use: No   Drug use: No   Sexual activity: Yes

## 2024-09-22 ENCOUNTER — Encounter: Payer: Self-pay | Admitting: Neurology

## 2024-10-30 ENCOUNTER — Encounter: Payer: Self-pay | Admitting: Radiology

## 2024-11-03 NOTE — Progress Notes (Signed)
 Initial neurology clinic note  Reason for Evaluation: Consultation requested by Marvine Rush, MD for an opinion regarding leg weakness. My final recommendations will be communicated back to the requesting physician by way of shared medical record or letter to requesting physician via [place]  mail.  HPI: This is Mr. Travis Warner, a 63 y.o. right-handed male with a medical history of HTN, HLD, vit D deficiency who presents to neurology clinic with the chief complaint of leg weakness. The patient is alone today.  Patient first noticed symptoms about 2 years ago. He states his legs started feeling more smooshy and not as strong. He had been walking every day and playing golf most days. He had stopped golf for non-health reasons. He is not sure if the weak feeling started before or after stopping golf. He still walks 2 miles on the treadmill every day. He does not feel like he is losing muscle. He is not having trouble with stairs. He denies falls.  He is not sure the weakness if progressive, but it is still there. He denies neck or back pain.  He denies any numbness or tingling.  He mentions he sees twitching of his calves, chest, and belly button.   He acts out his dreams. He kicks and punches. He mentions a time 30 years ago and set out all his guns. He woke up and saw this all set out. He mentions his wife has had to pull him back into bed. He thinks he may be awake, but not sure. He has never seen sleep medicine.   He denies any difficulty with smell. He has noticed a mild shaking of his right thumb at rest, but very rare.SABRA He does not seem to have a tremor with movement. He denies any freezing while trying to move.  He sees ortho for right shoulder spurs but otherwise arms do not have symptoms.  The patient denies symptoms suggestive of oculobulbar weakness including diplopia, ptosis, dysphagia, poor saliva control, dysarthria/dysphonia, impaired mastication, facial  weakness/droop.  There are no neuromuscular respiratory weakness symptoms, particularly orthopnea>dyspnea.   Pseudobulbar affect is absent.  The patient has not noticed any recent skin rashes nor does he report any constitutional symptoms like fever, night sweats, anorexia or unintentional weight loss.  EtOH use: none  Restrictive diet? no Family history of neuropathy/myopathy/neurologic disease? No  Patient has read on the internet and is most worried about parkinson's disease, but also heard his symptoms could be early ALS.    MEDICATIONS:  Outpatient Encounter Medications as of 11/15/2024  Medication Sig   ALPRAZolam (XANAX) 1 MG tablet Take 1 mg by mouth daily as needed for anxiety.    co-enzyme Q-10 30 MG capsule Take 100 mg by mouth daily.   olmesartan (BENICAR) 20 MG tablet Take 20 mg by mouth daily.  (Patient taking differently: Take 40 mg by mouth daily.)   rosuvastatin (CRESTOR) 20 MG tablet Take 20 mg by mouth at bedtime.   benzonatate  (TESSALON ) 100 MG capsule Take 1 capsule (100 mg total) by mouth every 8 (eight) hours.   cetirizine -pseudoephedrine  (ZYRTEC -D) 5-120 MG tablet Take 1 tablet by mouth daily.   omeprazole  (PRILOSEC) 40 MG capsule TAKE 1 CAPSULE(40 MG) BY MOUTH DAILY BEFORE BREAKFAST   oxyCODONE -acetaminophen  (PERCOCET) 5-325 MG tablet Take 1 tablet by mouth every 4 (four) hours as needed.   tamsulosin  (FLOMAX ) 0.4 MG CAPS capsule Take 1 capsule (0.4 mg total) by mouth daily.   Facility-Administered Encounter Medications as of 11/15/2024  Medication  water  for irrigation, sterile for irrigation SOLN    PAST MEDICAL HISTORY: Past Medical History:  Diagnosis Date   Cancer (HCC)    skin cancer- basal cell on face   Chronic kidney disease    kidney stone   Hypercholesterolemia    Hypertension    Insomnia     PAST SURGICAL HISTORY: Past Surgical History:  Procedure Laterality Date   COLONOSCOPY  02/24/2012   Procedure: COLONOSCOPY;  Surgeon: Lamar CHRISTELLA Hollingshead, MD;  [facility];  Service: Endoscopy;  Laterality: N/A;  11:30 AM   COLONOSCOPY N/A 04/22/2017   Procedure: COLONOSCOPY;  Surgeon: Lamar CHRISTELLA Hollingshead, MD;  2 sessile serrated polyp/adenomas with high grade dysplasia. Due for repeat in 2021.    COLONOSCOPY WITH PROPOFOL  N/A 04/01/2020   Procedure: COLONOSCOPY WITH PROPOFOL ;  Surgeon: Hollingshead Lamar CHRISTELLA, MD;  [facility];  Service: Endoscopy;  Laterality: N/A;  2:00pm - pt knows to arrive at 8:30   CYSTOSCOPY WITH RETROGRADE PYELOGRAM, URETEROSCOPY AND STENT PLACEMENT Left 10/14/2020   Procedure: CYSTOSCOPY WITH RETROGRADE PYELOGRAM, DIAGNOSTIC URETEROSCOPY AND STENT PLACEMENT;  Surgeon: Sherrilee Belvie CROME, MD;  Location: [facility];  Service: Urology;  Laterality: Left;   CYSTOSCOPY WITH RETROGRADE PYELOGRAM, URETEROSCOPY AND STENT PLACEMENT Left 10/31/2020   Procedure: CYSTOSCOPY WITH RETROGRADE PYELOGRAM, URETEROSCOPY AND STENT EXCHANGE;  Surgeon: Sherrilee Belvie CROME, MD;  Location: [facility];  Service: Urology;  Laterality: Left;   HOLMIUM LASER APPLICATION Left 10/31/2020   Procedure: HOLMIUM LASER APPLICATION;  Surgeon: Sherrilee Belvie CROME, MD;  Location: [facility];  Service: Urology;  Laterality: Left;   POLYPECTOMY  04/22/2017   Procedure: POLYPECTOMY;  Surgeon: Lamar CHRISTELLA Hollingshead, MD;  [facility];  Service: Endoscopy;;  colon   POLYPECTOMY  04/01/2020   Procedure: POLYPECTOMY;  Surgeon: Hollingshead Lamar CHRISTELLA, MD;  [facility];  Service: Endoscopy;;   Skin cancer removed from face      ALLERGIES: No Known Allergies  FAMILY HISTORY: Family History  Problem Relation Age of Onset   COPD Mother    Liver cancer Father    Colon cancer Neg Hx     SOCIAL HISTORY: Social History   Tobacco Use   Smoking status: Never   Smokeless tobacco: Never  Vaping Use   Vaping status: Never Used  Substance Use Topics   Alcohol use: No   Drug use: No   Social History   Social History Narrative   Are you right handed  or left handed? Right   Are you currently employed ?    What is your current occupation? retired   Do you live at home alone?   Who lives with you? wife    What type of home do you live in: 1 story or 2 story? one    Caffiene weekend 2 cokes      OBJECTIVE: PHYSICAL EXAM: BP (!) 143/72   Pulse 62   Ht 5' 9 (1.753 m)   Wt 196 lb (88.9 kg)   SpO2 98%   BMI 28.94 kg/m   General: General appearance: Awake and alert. No distress. Cooperative with exam.  Skin: No obvious rash or jaundice. HEENT: Atraumatic. Anicteric. Lungs: Non-labored breathing on room air  Heart: Regular Extremities: No edema. No obvious deformity.  Musculoskeletal: No obvious joint swelling. Psych: Affect appropriate.  Neurological: Mental Status: Alert. Speech fluent. No pseudobulbar affect Cranial Nerves: CNII: No RAPD. Visual fields grossly intact. CNIII, IV, VI: PERRL. No nystagmus. EOMI. CN V: Facial  sensation intact bilaterally to fine touch. Masseter clench strong. Jaw jerk is negative. CN VII: Facial muscles symmetric and strong. No ptosis at rest. CN VIII: Hearing grossly intact bilaterally. CN IX: No hypophonia. CN X: Palate elevates symmetrically. CN XI: Full strength shoulder shrug bilaterally. CN XII: Tongue protrusion full and midline. No atrophy or fasciculations. No significant dysarthria Motor: Tone is normal. No clear bradykinesia. Positive for fasciculations in bilateral calves, but not in rest of leg, arms, or chest No atrophy. No grip or percussive myotonia.  Individual muscle group testing (MRC grade out of 5):  Movement     Neck flexion 5    Neck extension 5     Right Left   Shoulder abduction 5 5   Shoulder adduction 5 5   Shoulder ext rotation 5 5   Shoulder int rotation 5 5   Elbow flexion 5 5   Elbow extension 5 5   Finger abduction - FDI 5 5   Finger abduction - ADM 5 5   Finger extension 5 5   Finger distal flexion - 2/3 5 5    Finger distal flexion - 4/5 5  5    Thumb flexion - FPL 5 5   Thumb abduction - APB 5 5    Hip flexion 5 5   Hip extension 5 5   Hip adduction 5 5   Hip abduction 5 5   Knee extension 5 5   Knee flexion 5 5   Dorsiflexion 5 5   Plantarflexion 5 5    Reflexes:  Right Left   Bicep 2+ 2+   Tricep 2+ 2+   BrRad 2+ 2+   Knee 3+ 3+   Ankle 1+ 1+    Pathological Reflexes: Babinski: mute response bilaterally Hoffman: absent bilaterally Troemner: absent bilaterally Facial: absent bilaterally Midline tap: absent Sensation: Pinprick: Intact in all extremities Vibration: Intact in all extremities Proprioception: Intact in bilateral great toes Coordination: Intact finger-to- nose-finger bilaterally. Romberg negative. RAM mildly abnormal in LUE. Finger tapping and hand opening equivocal (maybe a little slow). Toe tapping normal. Gait: Able to rise from chair with arms crossed unassisted. Normal, narrow-based gait. Able to tandem walk. Able to walk on toes and heels. No freezing. Normal turns.  Lab and Test Review: External labs: HbA1c (06/20/24): 5.7 Vit D (06/22/24) wnl TSH (06/22/24) wnl Lipid panel (06/22/24): tChol 128, LDL 68, TG 144 CMP (06/22/24) significant for Cr 1.37 CBC w/ diff (06/22/24) unremarkable  Imaging/Procedures: Lumbar spine xray (02/17/19): FINDINGS: No fracture or spondylolisthesis is noted. Mild degenerative disc disease is noted at L4-5. Remaining disc spaces are unremarkable.   IMPRESSION: Mild degenerative disc disease is noted at L4-5. No acute abnormality seen in the lumbar spine.  Cervical spine xray (12/31/23): FINDINGS: There is no evidence of cervical spine fracture or prevertebral soft tissue swelling. Alignment is normal. No other significant bone abnormalities are identified.   IMPRESSION: Negative cervical spine radiographs.  ASSESSMENT: Travis Warner is a 63 y.o. male who presents for evaluation of leg weakness. He has a relevant medical history of HTN, HLD, vit D  deficiency. His neurological examination is pertinent for fasciculations in bilateral calves. There is no definitive muscle weakness on examination. The etiology of patient's symptoms is currently unclear. Patient was concerned about parkinson's disease, which given his REM sleep symptoms, could be of concern. He does not have definitive bradykinesia though. Weakness and fasciculations could be concerning for ALS, but I don't see significant weakness. If symptoms had been  present for 2 years, I would expect weakness. This could also be a metabolic process or cervical spine stenosis causing fasciculation though cervical spine xray looked okay in 12/2023. Fasciculations could also be benign fasciculations. I will work up further as below.  PLAN: -Blood work: B1, B12, CMP, Mg, Phos, ionized Ca, PTH, vit D -Sleep medicine consult -EMG of right arm and leg ?MRI brain and cervical spine  -Return to clinic in about 3 months  The impression above as well as the plan as outlined below were extensively discussed with the patient who voiced understanding. All questions were answered to their satisfaction.  When available, results of the above investigations and possible further recommendations will be communicated to the patient via telephone/MyChart. Patient to call office if not contacted after expected testing turnaround time.   Total time spent reviewing records, interview, history/exam, documentation, and coordination of care on day of encounter:  65 min   Thank you for allowing me to participate in patient's care.  If I can answer any additional questions, I would be pleased to do so.  Venetia Potters, MD   CC: Marvine Rush, MD [address] [place] [address]  CC: Referring provider: Marvine Rush, MD [address] [place],  [place] [address]

## 2024-11-15 ENCOUNTER — Encounter: Payer: Self-pay | Admitting: Neurology

## 2024-11-15 ENCOUNTER — Ambulatory Visit: Admitting: Sleep Medicine

## 2024-11-15 ENCOUNTER — Other Ambulatory Visit

## 2024-11-15 ENCOUNTER — Other Ambulatory Visit: Payer: Self-pay

## 2024-11-15 ENCOUNTER — Ambulatory Visit: Admitting: Neurology

## 2024-11-15 VITALS — BP 143/72 | HR 62 | Ht 69.0 in | Wt 196.0 lb

## 2024-11-15 VITALS — BP 126/78 | HR 64 | Temp 98.5°F | Ht 69.0 in | Wt 196.0 lb

## 2024-11-15 DIAGNOSIS — R253 Fasciculation: Secondary | ICD-10-CM | POA: Diagnosis not present

## 2024-11-15 DIAGNOSIS — R5383 Other fatigue: Secondary | ICD-10-CM

## 2024-11-15 DIAGNOSIS — I10 Essential (primary) hypertension: Secondary | ICD-10-CM | POA: Diagnosis not present

## 2024-11-15 DIAGNOSIS — R29898 Other symptoms and signs involving the musculoskeletal system: Secondary | ICD-10-CM | POA: Diagnosis not present

## 2024-11-15 DIAGNOSIS — R202 Paresthesia of skin: Secondary | ICD-10-CM

## 2024-11-15 DIAGNOSIS — G4733 Obstructive sleep apnea (adult) (pediatric): Secondary | ICD-10-CM | POA: Diagnosis not present

## 2024-11-15 DIAGNOSIS — G478 Other sleep disorders: Secondary | ICD-10-CM

## 2024-11-15 DIAGNOSIS — N189 Chronic kidney disease, unspecified: Secondary | ICD-10-CM

## 2024-11-15 DIAGNOSIS — G4752 REM sleep behavior disorder: Secondary | ICD-10-CM

## 2024-11-15 LAB — VITAMIN D 25 HYDROXY (VIT D DEFICIENCY, FRACTURES): Vit D, 25-Hydroxy: 33 ng/mL (ref 30–100)

## 2024-11-15 NOTE — Addendum Note (Signed)
 Addended by: DASIE RHODY A on: 11/15/2024 09:46 AM   Modules accepted: Orders

## 2024-11-15 NOTE — Progress Notes (Signed)
 Name:Travis Warner MRN: [id number] DOB: [birth year]   CHIEF COMPLAINT:  EXCESSIVE DAYTIME SLEEPINESS   HISTORY OF PRESENT ILLNESS: Travis Warner is a 63 y.o. w/ a h/o HTN and hyperlipidemia who presents for c/o loud snoring, witnessed apnea and excessive daytime sleepiness which has been present for several years. Reports dream enactment which occurs a few times per week. States that he has never injured himself or others. Reports nocturnal awakenings due to unclear reasons and has difficulty falling back to sleep. Denies any significant weight changes. Denies morning headaches, RLS symptoms, dream enactment, cataplexy, hypnagogic or hypnapompic hallucinations. Denies a family history of sleep apnea. Denies drowsy driving. Drinks a few sodas weekly, denies alcohol, tobacco or illicit drug use.   Bedtime 8:30-9 pm Sleep onset 1-2 hours Rise time 7:30 am   EPWORTH SLEEP SCORE     11/15/2024    2:48 PM  Results of the Epworth flowsheet  Sitting and reading 2  Watching TV 2  Sitting, inactive in a public place (e.g. a theatre or a meeting) 1  As a passenger in a car for an hour without a break 0  Lying down to rest in the afternoon when circumstances permit 3  Sitting and talking to someone 0  Sitting quietly after a lunch without alcohol 0  In a car, while stopped for a few minutes in traffic 0  Total score 8    PAST MEDICAL HISTORY :   has a past medical history of Cancer (HCC), Chronic kidney disease, Hypercholesterolemia, Hypertension, and Insomnia.  has a past surgical history that includes Colonoscopy (02/24/2012); Skin cancer removed from face; Colonoscopy (N/A, 04/22/2017); polypectomy (04/22/2017); Colonoscopy with propofol  (N/A, 04/01/2020); polypectomy (04/01/2020); Cystoscopy with retrograde pyelogram, ureteroscopy and stent placement (Left, 10/14/2020); Cystoscopy with retrograde pyelogram, ureteroscopy and stent placement (Left, 10/31/2020); and Holmium laser application  (Left, 10/31/2020). Prior to Admission medications   Medication Sig Start Date End Date Taking? Authorizing Provider  ALPRAZolam (XANAX) 1 MG tablet Take 1 mg by mouth daily as needed for anxiety.  02/17/19  Yes [provider]  co-enzyme Q-10 30 MG capsule Take 100 mg by mouth daily.   Yes [provider]  olmesartan (BENICAR) 20 MG tablet Take 20 mg by mouth daily.  Patient taking differently: Take 40 mg by mouth daily. 03/08/20  Yes [provider]  rosuvastatin (CRESTOR) 20 MG tablet Take 20 mg by mouth at bedtime. 03/01/20  Yes [provider]   No Known Allergies  FAMILY HISTORY:  family history includes COPD in his mother; Liver cancer in his father. SOCIAL HISTORY:  reports that he has never smoked. He has never used smokeless tobacco. He reports that he does not drink alcohol and does not use drugs.   Review of Systems:  Gen:  Denies  fever, sweats, chills weight loss  HEENT: Denies blurred vision, double vision, ear pain, eye pain, hearing loss, nose bleeds, sore throat Cardiac:  No dizziness, chest pain or heaviness, chest tightness,edema, No JVD Resp:   No cough, -sputum production, -shortness of breath,-wheezing, -hemoptysis,  Gi: Denies swallowing difficulty, stomach pain, nausea or vomiting, diarrhea, constipation, bowel incontinence Gu:  Denies bladder incontinence, burning urine Ext:   Denies Joint pain, stiffness or swelling Skin: Denies  skin rash, easy bruising or bleeding or hives Endoc:  Denies polyuria, polydipsia , polyphagia or weight change Psych:   Denies depression, insomnia or hallucinations  Other:  All other systems negative  VITAL SIGNS: BP  126/78   Pulse 64   Temp 98.5 F (36.9 C)   Ht 5' 9 (1.753 m)   Wt 196 lb (88.9 kg)   SpO2 97%   BMI 28.94 kg/m    Physical Examination:   General Appearance: No distress  EYES PERRLA, EOM intact.   NECK Supple, No JVD Pulmonary: normal breath sounds, No wheezing.   CardiovascularNormal S1,S2.  No m/r/g.   Abdomen: Benign, Soft, non-tender. Skin:   warm, no rashes, no ecchymosis  Extremities: normal, no cyanosis, clubbing. Neuro:without focal findings,  speech normal  PSYCHIATRIC: Mood, affect within normal limits.   ASSESSMENT AND PLAN  OSA I suspect that OSA is likely present due to clinical presentation. Discussed the consequences of untreated sleep apnea. Advised not to drive drowsy for safety of patient and others. Will complete further evaluation with a home sleep study and follow up to review results.    RBD Will complete further evaluation with a sleep study with RBD montage. Counseled patient on safety precautions for RBD and also discussed the association between RBD and neurocognitive disorders. Patient is currently following with neurology.   HTN Stable, on current management. Following with PCP.    MEDICATION ADJUSTMENTS/LABS AND TESTS ORDERED: Recommend Sleep Study   Patient  satisfied with Plan of action and management. All questions answered  Follow up to review split night study results and treatment plan.   I spent a total of 62 minutes reviewing chart data, face-to-face evaluation with the patient, counseling and coordination of care as detailed above.    Lekeisha Arenas, M.D.  Sleep Medicine [facility]

## 2024-11-15 NOTE — Patient Instructions (Signed)
 I saw you today for leg weakness and muscle twitching. I'm not sure the cause of your symptoms. Fortunately, your symptoms are very subtle on examination.  I think we do need to evaluate further with the following: -Lab work today -Muscle and nerve testing called EMG (see more information below)  I am also referring you to sleep medicine given your acting out during sleep. Someone will call you to schedule in the next 1-2 weeks. Let me know if you do not hear from someone   The physicians and staff at [facility] are committed to providing excellent care. You may receive a survey requesting feedback about your experience at our office. We strive to receive very good responses to the survey questions. If you feel that your experience would prevent you from giving the office a very good  response, please contact our office to try to remedy the situation. We may be reached at [phone]. Thank you for taking the time out of your busy day to complete the survey.  Venetia Potters, MD [facility]     ELECTROMYOGRAM AND NERVE CONDUCTION STUDIES (EMG/NCS) INSTRUCTIONS  How to Prepare The neurologist conducting the EMG will need to know if you have certain medical conditions. Tell the neurologist and other EMG lab personnel if you: Have a pacemaker or any other electrical medical device Take blood-thinning medications Have hemophilia, a blood-clotting disorder that causes prolonged bleeding Bathing Take a shower or bath shortly before your exam in order to remove oils from your skin. Don't apply lotions or creams before the exam.  What to Expect You'll likely be asked to change into a hospital gown for the procedure and lie down on an examination table. The following explanations can help you understand what will happen during the exam.  Electrodes. The neurologist or a technician places surface electrodes at various locations on your skin depending on where you're experiencing  symptoms. Or the neurologist may insert needle electrodes at different sites depending on your symptoms.  Sensations. The electrodes will at times transmit a tiny electrical current that you may feel as a twinge or spasm. The needle electrode may cause discomfort or pain that usually ends shortly after the needle is removed. If you are concerned about discomfort or pain, you may want to talk to the neurologist about taking a short break during the exam.  Instructions. During the needle EMG, the neurologist will assess whether there is any spontaneous electrical activity when the muscle is at rest - activity that isn't present in healthy muscle tissue - and the degree of activity when you slightly contract the muscle.  He or she will give you instructions on resting and contracting a muscle at appropriate times. Depending on what muscles and nerves the neurologist is examining, he or she may ask you to change positions during the exam.  After your EMG You may experience some temporary, minor bruising where the needle electrode was inserted into your muscle. This bruising should fade within several days. If it persists, contact your primary care doctor.

## 2024-11-20 LAB — VITAMIN B12: Vitamin B-12: 246 pg/mL (ref 200–1100)

## 2024-11-20 LAB — PARATHYROID HORMONE, INTACT (NO CA): PTH: 57 pg/mL (ref 16–77)

## 2024-11-20 LAB — COMPREHENSIVE METABOLIC PANEL WITH GFR
AG Ratio: 2 (calc) (ref 1.0–2.5)
ALT: 17 U/L (ref 9–46)
AST: 21 U/L (ref 10–35)
Albumin: 4.8 g/dL (ref 3.6–5.1)
Alkaline phosphatase (APISO): 47 U/L (ref 35–144)
BUN: 15 mg/dL (ref 7–25)
CO2: 27 mmol/L (ref 20–32)
Calcium: 9.5 mg/dL (ref 8.6–10.3)
Chloride: 105 mmol/L (ref 98–110)
Creat: 1.31 mg/dL (ref 0.70–1.35)
Globulin: 2.4 g/dL (ref 1.9–3.7)
Glucose, Bld: 87 mg/dL (ref 65–99)
Potassium: 4.4 mmol/L (ref 3.5–5.3)
Sodium: 142 mmol/L (ref 135–146)
Total Bilirubin: 0.7 mg/dL (ref 0.2–1.2)
Total Protein: 7.2 g/dL (ref 6.1–8.1)
eGFR: 61 mL/min/1.73m2 (ref >=60)

## 2024-11-20 LAB — MAGNESIUM: Magnesium: 2.1 mg/dL (ref 1.5–2.5)

## 2024-11-20 LAB — PHOSPHORUS: Phosphorus: 2.2 mg/dL — ABNORMAL LOW (ref 2.5–4.5)

## 2024-11-20 LAB — VITAMIN B1: Vitamin B1 (Thiamine): 9 nmol/L (ref 8–30)

## 2024-11-20 LAB — CALCIUM, IONIZED: Calcium, Ion: 5.1 mg/dL (ref 4.7–5.5)

## 2024-11-21 ENCOUNTER — Ambulatory Visit: Payer: Self-pay | Admitting: Neurology

## 2024-11-27 ENCOUNTER — Other Ambulatory Visit: Payer: Self-pay

## 2024-11-27 ENCOUNTER — Ambulatory Visit: Admitting: Neurology

## 2024-11-27 ENCOUNTER — Encounter: Payer: Self-pay | Admitting: Neurology

## 2024-11-27 DIAGNOSIS — R202 Paresthesia of skin: Secondary | ICD-10-CM

## 2024-11-27 DIAGNOSIS — R253 Fasciculation: Secondary | ICD-10-CM

## 2024-11-27 DIAGNOSIS — R5383 Other fatigue: Secondary | ICD-10-CM

## 2024-11-27 DIAGNOSIS — R29898 Other symptoms and signs involving the musculoskeletal system: Secondary | ICD-10-CM

## 2024-11-27 NOTE — Procedures (Signed)
 [facility]  [address] [place], [address]  [place], [place] [address] Tel: [phone] Fax: [phone] Test Date:  11/27/2024  Patient: Travis Warner DOB: 20-Apr-1961 Physician: Venetia Potters, MD  Sex: Male Height: 5' 9 Ref Phys: Venetia Potters, MD  ID#: [id number]   Technician:    History: This is a 63 year old male with leg weakness and fasciculations.  NCV & EMG Findings: Extensive electrodiagnostic evaluation of the right upper and lower limbs shows: Right sural, superficial peroneal/fibular, median, and ulnar sensory responses are within normal limits. Right peroneal/fibular (EDB), tibial (AH), median (APB), and ulnar (ADM) motor responses are within normal limits. Right H reflex latency is within normal limits. Chronic motor axon loss changes without accompanying active denervation changes are seen in the right medial head of gastrocnemius muscle. There are also sparse fasciculations in the right medial head of gastrocnemius muscle. All other tested muscles including cervical, thoracic, and lumbosacral paraspinal muscles are within normal limits.  Impression: This study shows no significant abnormalities. Specifically: No electrodiagnostic evidence of a large fiber sensorimotor neuropathy or myopathy. No definitive electrodiagnostic evidence of a right cervical (C5-C8) or lumbosacral (L3-S1) motor radiculopathy. No electrodiagnostic evidence of an evolving disorder of anterior horn cells (ie: motor neuron disease/ALS). Minimal chronic neurogenic changes and fasciculations isolated to the right medial head of gastrocnemius muscle are seen on needle examination, with no active or ongoing changes, that is of unclear significance. This finding is too limited in degree and distribution for diagnostic purposes.    ___________________________ Venetia Potters, MD    Nerve Conduction Studies Motor Nerve Results    Latency Amplitude F-Lat Segment Distance CV Comment  Site (ms)  Norm (mV) Norm (ms)  (cm) (m/s) Norm   Right Fibular (EDB) Motor  Ankle 3.3  < 6.0 6.2  > 2.5        Bel fib head 10.5 - 5.3 -  Bel fib head-Ankle 31.5 44  > 40   Pop fossa 12.4 - 5.1 -  Pop fossa-Bel fib head 9 47 -   Right Median (APB) Motor  Wrist 2.7  < 4.0 10.1  > 5.0        Elbow 7.7 - 9.8 -  Elbow-Wrist 30 60  > 50   Right Tibial (AH) Motor  Ankle 3.7  < 6.0 6.9  > 4.0        Knee 12.5 - 6.4 -  Knee-Ankle 44 50  > 40   Right Ulnar (ADM) Motor  Wrist 2.4  < 3.1 10.7  > 7.0        Bel elbow 6.5 - 9.5 -  Bel elbow-Wrist 23.5 57  > 50   Ab elbow 8.4 - 9.4 -  Ab elbow-Bel elbow 10 53 -    Sensory Sites    Neg Peak Lat Amplitude (O-P) Segment Distance Velocity Comment  Site (ms) Norm (V) Norm  (cm) (ms)   Right Median Sensory  Wrist-Dig II 3.2  < 3.8 33  > 10 Wrist-Dig II 13    Right Superficial Fibular Sensory  14 cm-Ankle 2.8  < 4.6 13  > 3 14 cm-Ankle 14    Right Sural Sensory  Calf-Lat mall 3.9  < 4.6 7  > 3 Calf-Lat mall 14    Right Ulnar Sensory  Wrist-Dig V 3.0  < 3.2 22  > 5 Wrist-Dig V 11     H-Reflex Results    M-Lat H Lat H Neg Amp H-M Lat  Site (ms) (  ms) Norm (mV) (ms)  Right Tibial H-Reflex  Pop fossa 5.4 33.6  < 35.0 1.17 28.2   Electromyography   Side Muscle Ins.Act Fibs Fasc Recrt Amp Dur Poly Activation Comment  Right Tib ant Nml Nml Nml Nml Nml Nml Nml Nml N/A  Right Gastroc MH Nml Nml *1+ *1- *1+ *1+ *1+ Nml N/A  Right Rectus fem Nml Nml Nml Nml Nml Nml Nml Nml N/A  Right Biceps fem SH Nml Nml Nml Nml Nml Nml Nml Nml N/A  Right Gluteus med Nml Nml Nml Nml Nml Nml Nml Nml N/A  Right Gluteus max Nml Nml Nml Nml Nml Nml Nml Nml N/A  Right L5 PSP Nml Nml Nml Nml Nml Nml Nml Nml N/A  Right T10 PSP Nml Nml Nml Nml Nml Nml Nml Nml N/A  Right T5 PSP Nml Nml Nml Nml Nml Nml Nml Nml N/A  Right C7 PSP Nml Nml Nml Nml Nml Nml Nml Nml N/A  Right FDI Nml Nml Nml Nml Nml Nml Nml Nml N/A  Right Pronator teres Nml Nml Nml Nml Nml Nml Nml Nml N/A  Right APB Nml  Nml Nml Nml Nml Nml Nml Nml N/A  Right Biceps Nml Nml Nml Nml Nml Nml Nml Nml N/A  Right Triceps lat hd Nml Nml Nml Nml Nml Nml Nml Nml N/A  Right Deltoid Nml Nml Nml Nml Nml Nml Nml Nml N/A      Waveforms:  Motor           Sensory           H-Reflex

## 2024-11-28 ENCOUNTER — Other Ambulatory Visit: Payer: Self-pay

## 2024-11-28 DIAGNOSIS — R5383 Other fatigue: Secondary | ICD-10-CM

## 2024-11-28 DIAGNOSIS — R202 Paresthesia of skin: Secondary | ICD-10-CM

## 2024-11-28 DIAGNOSIS — R253 Fasciculation: Secondary | ICD-10-CM

## 2024-11-28 DIAGNOSIS — R29898 Other symptoms and signs involving the musculoskeletal system: Secondary | ICD-10-CM

## 2024-12-11 DIAGNOSIS — L814 Other melanin hyperpigmentation: Secondary | ICD-10-CM | POA: Diagnosis not present

## 2024-12-11 DIAGNOSIS — Z85828 Personal history of other malignant neoplasm of skin: Secondary | ICD-10-CM | POA: Diagnosis not present

## 2024-12-11 DIAGNOSIS — L821 Other seborrheic keratosis: Secondary | ICD-10-CM | POA: Diagnosis not present

## 2024-12-11 DIAGNOSIS — L570 Actinic keratosis: Secondary | ICD-10-CM | POA: Diagnosis not present

## 2024-12-11 DIAGNOSIS — D0462 Carcinoma in situ of skin of left upper limb, including shoulder: Secondary | ICD-10-CM | POA: Diagnosis not present

## 2024-12-29 ENCOUNTER — Ambulatory Visit: Admitting: Neurology

## 2025-01-11 ENCOUNTER — Ambulatory Visit: Admitting: Physician Assistant

## 2025-01-11 ENCOUNTER — Encounter: Payer: Self-pay | Admitting: Physician Assistant

## 2025-01-11 VITALS — BP 134/87 | Ht 69.0 in | Wt 203.8 lb

## 2025-01-11 DIAGNOSIS — K219 Gastro-esophageal reflux disease without esophagitis: Secondary | ICD-10-CM | POA: Diagnosis not present

## 2025-01-11 DIAGNOSIS — E782 Mixed hyperlipidemia: Secondary | ICD-10-CM | POA: Diagnosis not present

## 2025-01-11 NOTE — Assessment & Plan Note (Signed)
 Heartburn symptoms have improved with omeprazole , but wheezing persists. No shortness of breath or coughing reported. - Continue omeprazole  - Monitor symptoms and return if wheezing persists or if shortness of breath or coughing develops

## 2025-01-11 NOTE — Progress Notes (Signed)
 "  New Patient Office Visit  Subjective    Patient ID: Travis Warner, male    DOB: 08-28-1961  Age: 64 y.o. MRN: [id number]  CC:  Chief Complaint  Patient presents with   Establish Care    No concerns    HPI Travis Warner presents to establish care  Discussed the use of AI scribe software for clinical note transcription with the patient, who gave verbal consent to proceed.  History of Present Illness Travis Warner is a 64 year old male who presents to establish care.  He recently had a virtual visit for nightly heartburn and hoarseness in his throat, along with mild wheezing. A PPI was prescribed, which alleviated the heartburn, but the wheezing persists. This treatment was initiated approximately one week ago. He has gained about forty pounds over the last four years, which he believes may be contributing to his symptoms.   He has a history of high blood pressure and high cholesterol, for which he takes medication. He also occasionally takes Xanax for tinnitus, particularly at night to aid sleep.  He denies shortness of breath, coughing, or chest pain. He denies any alcohol, drug use, or smoking. He has not had any major surgeries but has had several skin cancers removed. He denies any concerns or complaints for this visit.     Outpatient Encounter Medications as of 01/11/2025  Medication Sig   ALPRAZolam (XANAX) 1 MG tablet Take 1 mg by mouth daily as needed for anxiety.    co-enzyme Q-10 30 MG capsule Take 100 mg by mouth daily.   olmesartan (BENICAR) 20 MG tablet Take 20 mg by mouth daily.  (Patient taking differently: Take 40 mg by mouth daily.)   rosuvastatin (CRESTOR) 20 MG tablet Take 20 mg by mouth at bedtime.   [DISCONTINUED] water  for irrigation, sterile for irrigation SOLN    No facility-administered encounter medications on file as of 01/11/2025.    Past Medical History:  Diagnosis Date   Cancer (HCC)    skin cancer- basal cell on face   Chronic  kidney disease    kidney stone   Hypercholesterolemia    Hypertension    Insomnia     Past Surgical History:  Procedure Laterality Date   COLONOSCOPY  02/24/2012   Procedure: COLONOSCOPY;  Surgeon: Lamar CHRISTELLA Hollingshead, MD;  Location: [facility];  Service: Endoscopy;  Laterality: N/A;  11:30 AM   COLONOSCOPY N/A 04/22/2017   Procedure: COLONOSCOPY;  Surgeon: Lamar CHRISTELLA Hollingshead, MD;  2 sessile serrated polyp/adenomas with high grade dysplasia. Due for repeat in 2021.    COLONOSCOPY WITH PROPOFOL  N/A 04/01/2020   Procedure: COLONOSCOPY WITH PROPOFOL ;  Surgeon: Hollingshead Lamar CHRISTELLA, MD;  Location: [facility];  Service: Endoscopy;  Laterality: N/A;  2:00pm - pt knows to arrive at 8:30   CYSTOSCOPY WITH RETROGRADE PYELOGRAM, URETEROSCOPY AND STENT PLACEMENT Left 10/14/2020   Procedure: CYSTOSCOPY WITH RETROGRADE PYELOGRAM, DIAGNOSTIC URETEROSCOPY AND STENT PLACEMENT;  Surgeon: Sherrilee Belvie CROME, MD;  Location: [facility];  Service: Urology;  Laterality: Left;   CYSTOSCOPY WITH RETROGRADE PYELOGRAM, URETEROSCOPY AND STENT PLACEMENT Left 10/31/2020   Procedure: CYSTOSCOPY WITH RETROGRADE PYELOGRAM, URETEROSCOPY AND STENT EXCHANGE;  Surgeon: Sherrilee Belvie CROME, MD;  Location: [facility];  Service: Urology;  Laterality: Left;   HOLMIUM LASER APPLICATION Left 10/31/2020   Procedure: HOLMIUM LASER APPLICATION;  Surgeon: Sherrilee Belvie CROME, MD;  Location: [facility];  Service: Urology;  Laterality: Left;   POLYPECTOMY  04/22/2017  Procedure: POLYPECTOMY;  Surgeon: Lamar CHRISTELLA Hollingshead, MD;  Location: [facility];  Service: Endoscopy;;  colon   POLYPECTOMY  04/01/2020   Procedure: POLYPECTOMY;  Surgeon: Hollingshead Lamar CHRISTELLA, MD;  Location: [facility];  Service: Endoscopy;;   Skin cancer removed from face      Family History  Problem Relation Age of Onset   COPD Mother    Liver cancer Father    Colon cancer Neg Hx     Social History   Socioeconomic History   Marital status: Married    Spouse name: Not on file   Number of  children: 2   Years of education: Not on file   Highest education level: GED or equivalent  Occupational History   Not on file  Tobacco Use   Smoking status: Never   Smokeless tobacco: Never  Vaping Use   Vaping status: Never Used  Substance and Sexual Activity   Alcohol use: No   Drug use: No   Sexual activity: Yes  Other Topics Concern   Not on file  Social History Narrative   Are you right handed or left handed? Right   Are you currently employed ?    What is your current occupation? retired   Do you live at home alone?   Who lives with you? wife    What type of home do you live in: 1 story or 2 story? one    Caffiene weekend 2 cokes    Social Drivers of Health   Tobacco Use: Low Risk (11/15/2024)   Patient History    Smoking Tobacco Use: Never    Smokeless Tobacco Use: Never    Passive Exposure: Not on file  Financial Resource Strain: Low Risk (01/10/2025)   Overall Financial Resource Strain (CARDIA)    Difficulty of Paying Living Expenses: Not very hard  Food Insecurity: No Food Insecurity (01/10/2025)   Epic    Worried About Programme Researcher, Broadcasting/film/video in the Last Year: Never true    Ran Out of Food in the Last Year: Never true  Transportation Needs: No Transportation Needs (01/10/2025)   Epic    Lack of Transportation (Medical): No    Lack of Transportation (Non-Medical): No  Physical Activity: Insufficiently Active (01/10/2025)   Exercise Vital Sign    Days of Exercise per Week: 1 day    Minutes of Exercise per Session: 20 min  Stress: Stress Concern Present (01/10/2025)   [employer] of Occupational Health - Occupational Stress Questionnaire    Feeling of Stress: To some extent  Social Connections: Moderately Isolated (01/10/2025)   Social Connection and Isolation Panel    Frequency of Communication with Friends and Family: Once a week    Frequency of Social Gatherings with Friends and Family: Once a week    Attends Religious Services: 1 to 4 times per year     Active Member of [employer] or Organizations: No    Attends Engineer, Structural: Not on file    Marital Status: Married  Catering Manager Violence: Not on file  Depression (PHQ2-9): Low Risk (01/11/2025)   Depression (PHQ2-9)    PHQ-2 Score: 0  Alcohol Screen: Not on file  Housing: Low Risk (01/10/2025)   Epic    Unable to Pay for Housing in the Last Year: No    Number of Times Moved in the Last Year: 0    Homeless in the Last Year: No  Utilities: Not on file  Health Literacy: Not on file  Review of Systems  Constitutional:  Negative for activity change, appetite change, fatigue and fever.  Eyes:  Negative for visual disturbance.  Respiratory:  Positive for wheezing. Negative for cough and shortness of breath.   Cardiovascular:  Negative for chest pain.  Gastrointestinal:  Negative for constipation, diarrhea, nausea and vomiting.  Neurological:  Negative for light-headedness and headaches.  Psychiatric/Behavioral:  Negative for sleep disturbance. The patient is not nervous/anxious.          Objective    BP 134/87   Ht 5' 9 (1.753 m)   Wt 203 lb 12.8 oz (92.4 kg)   BMI 30.10 kg/m   Physical Exam Constitutional:      General: He is not in acute distress.    Appearance: Normal appearance. He is not ill-appearing.  HENT:     Head: Normocephalic and atraumatic.     Mouth/Throat:     Mouth: Mucous membranes are moist.     Pharynx: Oropharynx is clear.  Eyes:     Extraocular Movements: Extraocular movements intact.     Conjunctiva/sclera: Conjunctivae normal.  Cardiovascular:     Rate and Rhythm: Normal rate and regular rhythm.     Heart sounds: Normal heart sounds. No murmur heard. Pulmonary:     Effort: Pulmonary effort is normal.     Breath sounds: Normal breath sounds. No wheezing, rhonchi or rales.  Musculoskeletal:     Right lower leg: No edema.     Left lower leg: No edema.  Skin:    General: Skin is warm and dry.  Neurological:     General: No  focal deficit present.     Mental Status: He is alert and oriented to person, place, and time.  Psychiatric:        Mood and Affect: Mood normal.        Behavior: Behavior normal.       Assessment & Plan:  Mixed hyperlipidemia Assessment & Plan: Stable. Continue with current management without changes. Discussed healthy diet and lifestyle. Lipid panel today.    Orders: -     Lipid panel  Gastroesophageal reflux disease without esophagitis Assessment & Plan: Heartburn symptoms have improved with omeprazole , but wheezing persists. No shortness of breath or coughing reported. - Continue omeprazole  - Monitor symptoms and return if wheezing persists or if shortness of breath or coughing develops     Return in about 6 months (around 07/11/2025) for phys .   Lendell Gallick, PA-C   "

## 2025-01-11 NOTE — Assessment & Plan Note (Signed)
 Stable. Continue with current management without changes. Discussed healthy diet and lifestyle. Lipid panel today.

## 2025-01-23 ENCOUNTER — Other Ambulatory Visit

## 2025-02-21 ENCOUNTER — Ambulatory Visit: Admitting: Neurology

## 2025-03-09 ENCOUNTER — Ambulatory Visit: Admitting: Physician Assistant

## 2025-07-11 ENCOUNTER — Encounter: Admitting: Physician Assistant
